# Patient Record
Sex: Female | Born: 1990 | Race: White | Hispanic: No | Marital: Married | Smoking: Never smoker
Health system: Southern US, Community
[De-identification: ages and names within clinical notes are randomized; demographics above are authoritative.]

## PROBLEM LIST (undated history)

## (undated) ENCOUNTER — Inpatient Hospital Stay (HOSPITAL_COMMUNITY): Payer: Self-pay

## (undated) DIAGNOSIS — Z8489 Family history of other specified conditions: Secondary | ICD-10-CM

## (undated) DIAGNOSIS — N62 Hypertrophy of breast: Secondary | ICD-10-CM

## (undated) DIAGNOSIS — R203 Hyperesthesia: Secondary | ICD-10-CM

## (undated) HISTORY — PX: CYSTOSCOPY: SUR368

## (undated) HISTORY — PX: WISDOM TOOTH EXTRACTION: SHX21

## (undated) HISTORY — PX: WRIST GANGLION EXCISION: SHX840

---
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
## 2012-11-15 ENCOUNTER — Other Ambulatory Visit: Payer: Self-pay | Admitting: Obstetrics and Gynecology

## 2012-11-15 ENCOUNTER — Encounter (HOSPITAL_COMMUNITY): Payer: Self-pay | Admitting: Pharmacist

## 2012-11-16 ENCOUNTER — Other Ambulatory Visit (HOSPITAL_COMMUNITY): Payer: Self-pay | Admitting: Obstetrics and Gynecology

## 2012-11-16 DIAGNOSIS — O021 Missed abortion: Secondary | ICD-10-CM

## 2012-11-20 ENCOUNTER — Ambulatory Visit (HOSPITAL_COMMUNITY): Payer: 59

## 2012-11-20 ENCOUNTER — Encounter (HOSPITAL_COMMUNITY)
Admission: AD | Disposition: A | Discharge status: Home / Self Care | Payer: Self-pay | Source: Ambulatory Visit | Attending: Obstetrics and Gynecology

## 2012-11-20 ENCOUNTER — Ambulatory Visit (HOSPITAL_COMMUNITY)
Admission: AD | Admit: 2012-11-20 | Discharge: 2012-11-20 | Disposition: A | Discharge status: Home / Self Care | Payer: 59 | Source: Ambulatory Visit | Attending: Obstetrics and Gynecology | Admitting: Obstetrics and Gynecology

## 2012-11-20 ENCOUNTER — Encounter (HOSPITAL_COMMUNITY): Payer: 59 | Admitting: Anesthesiology

## 2012-11-20 ENCOUNTER — Ambulatory Visit (HOSPITAL_COMMUNITY): Payer: 59 | Admitting: Anesthesiology

## 2012-11-20 ENCOUNTER — Encounter (HOSPITAL_COMMUNITY): Payer: Self-pay | Admitting: Anesthesiology

## 2012-11-20 DIAGNOSIS — O021 Missed abortion: Secondary | ICD-10-CM | POA: Insufficient documentation

## 2012-11-20 HISTORY — PX: DILATION AND EVACUATION: SHX1459

## 2012-11-20 LAB — ABO/RH: ABO/RH(D): A POS

## 2012-11-20 LAB — CBC
Hemoglobin: 13.4 g/dL (ref 12.0–15.0)
MCH: 28.9 pg (ref 26.0–34.0)
MCHC: 33.4 g/dL (ref 30.0–36.0)
Platelets: 260 10*3/uL (ref 150–400)

## 2012-11-20 SURGERY — DILATION AND EVACUATION, UTERUS
Anesthesia: Monitor Anesthesia Care | Site: Vagina | Wound class: Clean Contaminated

## 2012-11-20 MED ORDER — PROPOFOL 10 MG/ML IV EMUL
INTRAVENOUS | Status: DC | PRN
  Administered 2012-11-20 (×5): 20 mg via INTRAVENOUS
  Administered 2012-11-20: 40 mg via INTRAVENOUS

## 2012-11-20 MED ORDER — FENTANYL CITRATE 0.05 MG/ML IJ SOLN
INTRAMUSCULAR | Status: AC
  Filled 2012-11-20: qty 2

## 2012-11-20 MED ORDER — PROPOFOL 10 MG/ML IV EMUL
INTRAVENOUS | Status: AC
  Filled 2012-11-20: qty 40

## 2012-11-20 MED ORDER — KETOROLAC TROMETHAMINE 30 MG/ML IJ SOLN
INTRAMUSCULAR | Status: AC
  Filled 2012-11-20: qty 1

## 2012-11-20 MED ORDER — LIDOCAINE HCL (CARDIAC) 20 MG/ML IV SOLN
INTRAVENOUS | Status: AC
  Filled 2012-11-20: qty 5

## 2012-11-20 MED ORDER — FENTANYL CITRATE 0.05 MG/ML IJ SOLN
INTRAMUSCULAR | Status: DC | PRN
  Administered 2012-11-20 (×2): 50 ug via INTRAVENOUS

## 2012-11-20 MED ORDER — HYDROCODONE-ACETAMINOPHEN 5-325 MG PO TABS: ORAL_TABLET | ORAL | Status: DC

## 2012-11-20 MED ORDER — KETOROLAC TROMETHAMINE 30 MG/ML IJ SOLN
INTRAMUSCULAR | Status: DC | PRN
  Administered 2012-11-20: 30 mg via INTRAVENOUS

## 2012-11-20 MED ORDER — LACTATED RINGERS IV SOLN: INTRAVENOUS | Status: DC

## 2012-11-20 MED ORDER — LIDOCAINE HCL 1 % IJ SOLN
INTRAMUSCULAR | Status: DC | PRN
  Administered 2012-11-20: 10 mL

## 2012-11-20 MED ORDER — LACTATED RINGERS IV SOLN
INTRAVENOUS | Status: DC
  Administered 2012-11-20: 11:00:00 via INTRAVENOUS

## 2012-11-20 MED ORDER — ONDANSETRON HCL 4 MG/2ML IJ SOLN
INTRAMUSCULAR | Status: DC | PRN
  Administered 2012-11-20: 4 mg via INTRAVENOUS

## 2012-11-20 MED ORDER — MIDAZOLAM HCL 2 MG/2ML IJ SOLN
INTRAMUSCULAR | Status: DC | PRN
  Administered 2012-11-20: 2 mg via INTRAVENOUS

## 2012-11-20 MED ORDER — MIDAZOLAM HCL 2 MG/2ML IJ SOLN
INTRAMUSCULAR | Status: AC
  Filled 2012-11-20: qty 2

## 2012-11-20 MED ORDER — ONDANSETRON HCL 4 MG/2ML IJ SOLN
INTRAMUSCULAR | Status: AC
  Filled 2012-11-20: qty 2

## 2012-11-20 SURGICAL SUPPLY — 20 items
CATH ROBINSON RED A/P 16FR (CATHETERS) ×2 IMPLANT
CLOTH BEACON ORANGE TIMEOUT ST (SAFETY) ×2 IMPLANT
DECANTER SPIKE VIAL GLASS SM (MISCELLANEOUS) ×2 IMPLANT
DILATOR CANAL MILEX (MISCELLANEOUS) IMPLANT
GLOVE BIO SURGEON STRL SZ7 (GLOVE) ×2 IMPLANT
GOWN STRL REIN XL XLG (GOWN DISPOSABLE) ×4 IMPLANT
KIT BERKELEY 1ST TRIMESTER 3/8 (MISCELLANEOUS) ×2 IMPLANT
NDL SPNL 22GX3.5 QUINCKE BK (NEEDLE) ×1 IMPLANT
NEEDLE SPNL 22GX3.5 QUINCKE BK (NEEDLE) ×2 IMPLANT
NS IRRIG 1000ML POUR BTL (IV SOLUTION) ×2 IMPLANT
PACK VAGINAL MINOR WOMEN LF (CUSTOM PROCEDURE TRAY) ×2 IMPLANT
PAD OB MATERNITY 4.3X12.25 (PERSONAL CARE ITEMS) ×2 IMPLANT
PAD PREP 24X48 CUFFED NSTRL (MISCELLANEOUS) ×2 IMPLANT
SET BERKELEY SUCTION TUBING (SUCTIONS) ×2 IMPLANT
SYR CONTROL 10ML LL (SYRINGE) ×2 IMPLANT
TOWEL OR 17X24 6PK STRL BLUE (TOWEL DISPOSABLE) ×4 IMPLANT
VACURETTE 10 RIGID CVD (CANNULA) IMPLANT
VACURETTE 7MM CVD STRL WRAP (CANNULA) IMPLANT
VACURETTE 8 RIGID CVD (CANNULA) ×1 IMPLANT
VACURETTE 9 RIGID CVD (CANNULA) IMPLANT

## 2012-11-20 NOTE — H&P (Signed)
SARABELLA CAPRIO is an 22 y.o. female D&E for missed abortion  22 yo G1P0 who presented for her First prenatal visit on 11/14/2012. Based on a last menstrual period of 09/12/2012 The patient should have been 9 weeks +0 days estimated gestational age. Ultrasound at that visit showed a intrauterine pregnancy with a fetal pole measuring 7+3 weeks, however no fetal cardiac activity is visualized consistent with a missed abortion. At that time patient was in shock and was unable to make a position as to how she wished to proceed. She wishes to proceed today with definitive surgical management. She has requested a preoperative repeat transvaginal ultrasound to confirm the prior finding of fetal demise on 11/14/2012. This will be performed prior to surgery. Risks benefits alternatives of procedure were discussed at length with the patient.   No LMP recorded.    No past medical history on file.  No past surgical history on file.  No family history on file.  Social History:  has no tobacco, alcohol, and drug history on file.  Allergies: No Known Allergies  No prescriptions prior to admission    ROS  Height 5\' 5"  (1.651 m), weight 103.874 kg (229 lb). Physical Exam  No results found for this or any previous visit (from the past 24 hour(s)).  No results found.  Assessment/Plan: 1) Admit 2) Repeat TVUS 3) Consent for suction D&E  Trixy Loyola H. 11/20/2012, 9:07 AM

## 2012-11-20 NOTE — Anesthesia Postprocedure Evaluation (Signed)
Anesthesia Post Note  Patient: [name] Rosales  Procedure(s) Performed: Procedure(s) (LRB): DILATATION AND EVACUATION WITH ULTRASOUND BEFORE SURGERY (N/A)  Anesthesia type: MAC  Patient location: PACU  Post pain: Pain level controlled  Post assessment: Post-op Vital signs reviewed  Last Vitals:  Filed Vitals:   11/20/12 1200  BP:   Pulse: 84  Temp:   Resp: 20    Post vital signs: Reviewed  Level of consciousness: sedated  Complications: No apparent anesthesia complications

## 2012-11-20 NOTE — Op Note (Signed)
Pre-Operative Diagnosis: 1) 9 week missed abortion Postoperative Diagnosis: Same Procedure: Suction Dilation & Evacuation Surgeon: Dr. Waynard Reeds Assistant: None Operative Findings: Midposition uterus, POCs Specimen: POCs EBL: Total I/O In: 500 [I.V.:500] Out: 75 [Urine:75]   [name] Rosales Is a 22 year old gravida 1 para 0 who presents for definitive surgical management for 9 week missed abortion. Please see the patient's history and physical for complete details of the history. Management options were discussed with the patient. R/B/A reviewed. Following appropriate informed consent was taken to the operating room. The patient was appropriately identified during a time out procedure. MAC anesthesia was administered and the patient was placed in the dorsal lithotomy position. The patient was prepped and draped in the normal sterile fashion. A speculum was placed into the vagina, a single-tooth tenaculum was placed on the anterior lip of the cervix, and 10 cc of 1% lidocaine was administered in a paracervical fashion. The cervix was serially dilated with Hank dilators. A 8 french suction curet was then passed to the fundus, the vacuum was engaged, and 3 suction passes were performed with a curette. A Sharp curettage was performed and a gritty texture was noted. A final suction pass was performed with minimal results. This completed the procedure. The patient tolerated the procedure well was brought to the recovery room in stable condition for the procedure. All sponge and needle counts correct x2.

## 2012-11-20 NOTE — Transfer of Care (Signed)
Immediate Anesthesia Transfer of Care Note  Patient: [name] Rosales  Procedure(s) Performed: Procedure(s): DILATATION AND EVACUATION WITH ULTRASOUND BEFORE SURGERY (N/A)  Patient Location: PACU  Anesthesia Type:MAC  Level of Consciousness: awake, alert  and oriented  Airway & Oxygen Therapy: Patient Spontanous Breathing and Patient connected to nasal cannula oxygen  Post-op Assessment: Report given to PACU RN and Post -op Vital signs reviewed and stable  Post vital signs: Reviewed and stable  Complications: No apparent anesthesia complications

## 2012-11-20 NOTE — Interval H&P Note (Signed)
History and Physical Interval Note:  Pre-operative [place] confirms non-viable pregnancy. No fetal cardiac activity visualized. Patient comfortable with proceeding. Pts BT confirmed positive in office  11/20/2012 11:02 AM  [name] Rosales  has presented today for surgery, with the diagnosis of missed abortion [phone]  The various methods of treatment have been discussed with the patient and family. After consideration of risks, benefits and other options for treatment, the patient has consented to  Procedure(s): DILATATION AND EVACUATION WITH ULTRASOUND BEFORE SURGERY (N/A) as a surgical intervention .  The patient's history has been reviewed, patient examined, no change in status, stable for surgery.  I have reviewed the patient's chart and labs.  Questions were answered to the patient's satisfaction.     Charnika Herbst H.

## 2012-11-20 NOTE — Anesthesia Preprocedure Evaluation (Signed)
Anesthesia Evaluation  Patient identified by MRN, date of birth, ID band Patient awake    Reviewed: Allergy & Precautions, H&P , NPO status , Patient's Chart, lab work & pertinent test results  Airway Mallampati: III TM Distance: >3 FB Neck ROM: Full    Dental no notable dental hx. (+) Teeth Intact   Pulmonary neg pulmonary ROS,  breath sounds clear to auscultation  Pulmonary exam normal       Cardiovascular negative cardio ROS  Rhythm:Regular Rate:Normal     Neuro/Psych negative neurological ROS  negative psych ROS   GI/Hepatic negative GI ROS, Neg liver ROS,   Endo/Other  Morbid obesity  Renal/GU negative Renal ROS  negative genitourinary   Musculoskeletal negative musculoskeletal ROS (+)   Abdominal (+) + obese,   Peds  Hematology negative hematology ROS (+)   Anesthesia Other Findings   Reproductive/Obstetrics (+) Pregnancy Missed Ab  10 weeks                            Anesthesia Physical Anesthesia Plan  ASA: II  Anesthesia Plan: MAC   Post-op Pain Management:    Induction: Intravenous  Airway Management Planned: Natural Airway and Simple Face Mask  Additional Equipment:   Intra-op Plan:   Post-operative Plan:   Informed Consent: I have reviewed the patients History and Physical, chart, labs and discussed the procedure including the risks, benefits and alternatives for the proposed anesthesia with the patient or authorized representative who has indicated his/her understanding and acceptance.     Plan Discussed with: CRNA, Anesthesiologist and Surgeon  Anesthesia Plan Comments:         Anesthesia Quick Evaluation

## 2012-11-21 ENCOUNTER — Encounter (HOSPITAL_COMMUNITY): Payer: Self-pay | Admitting: Obstetrics and Gynecology

## 2013-07-10 ENCOUNTER — Encounter (HOSPITAL_COMMUNITY): Payer: Self-pay

## 2013-07-10 ENCOUNTER — Inpatient Hospital Stay (HOSPITAL_COMMUNITY): Payer: 59

## 2013-07-10 ENCOUNTER — Inpatient Hospital Stay (HOSPITAL_COMMUNITY)
Admission: AD | Admit: 2013-07-10 | Discharge: 2013-07-10 | Disposition: A | Discharge status: Home / Self Care | Payer: 59 | Source: Ambulatory Visit | Attending: Obstetrics and Gynecology | Admitting: Obstetrics and Gynecology

## 2013-07-10 DIAGNOSIS — O209 Hemorrhage in early pregnancy, unspecified: Secondary | ICD-10-CM | POA: Insufficient documentation

## 2013-07-10 LAB — URINALYSIS, ROUTINE W REFLEX MICROSCOPIC
Bilirubin Urine: NEGATIVE
Glucose, UA: NEGATIVE mg/dL
Ketones, ur: NEGATIVE mg/dL
LEUKOCYTES UA: NEGATIVE
NITRITE: NEGATIVE
Protein, ur: NEGATIVE mg/dL
UROBILINOGEN UA: 0.2 mg/dL (ref 0.0–1.0)
pH: 5.5 (ref 5.0–8.0)

## 2013-07-10 LAB — URINE MICROSCOPIC-ADD ON

## 2013-07-10 NOTE — MAU Note (Signed)
Patient states she had her first appointment yesterday. States she started having cramping this am and had a 50 cent size dark red blood clot.

## 2013-07-10 NOTE — Discharge Instructions (Signed)
Keep your scheduled appointment for prenatal care. Call the office or provider on call with increased bleeding, pain or further concerns. Pelvic rest X 1 week.

## 2013-07-12 ENCOUNTER — Encounter (HOSPITAL_COMMUNITY): Payer: Self-pay | Admitting: Family

## 2013-07-12 ENCOUNTER — Inpatient Hospital Stay (HOSPITAL_COMMUNITY): Payer: 59

## 2013-07-12 ENCOUNTER — Inpatient Hospital Stay (HOSPITAL_COMMUNITY)
Admission: AD | Admit: 2013-07-12 | Discharge: 2013-07-12 | Disposition: A | Discharge status: Home / Self Care | Payer: 59 | Source: Ambulatory Visit | Attending: Obstetrics and Gynecology | Admitting: Obstetrics and Gynecology

## 2013-07-12 DIAGNOSIS — O021 Missed abortion: Secondary | ICD-10-CM | POA: Insufficient documentation

## 2013-07-12 LAB — URINALYSIS, ROUTINE W REFLEX MICROSCOPIC
BILIRUBIN URINE: NEGATIVE
GLUCOSE, UA: NEGATIVE mg/dL
Ketones, ur: NEGATIVE mg/dL
Nitrite: NEGATIVE
PROTEIN: 30 mg/dL — AB
Specific Gravity, Urine: 1.025 (ref 1.005–1.030)
Urobilinogen, UA: 2 mg/dL — ABNORMAL HIGH (ref 0.0–1.0)
pH: 6 (ref 5.0–8.0)

## 2013-07-12 LAB — URINE MICROSCOPIC-ADD ON

## 2013-07-12 NOTE — Anesthesia Preprocedure Evaluation (Addendum)
Anesthesia Evaluation  Patient identified by MRN, date of birth, ID band Patient awake    Reviewed: Allergy & Precautions, H&P , Patient's Chart, lab work & pertinent test results, reviewed documented beta blocker date and time   History of Anesthesia Complications Negative for: history of anesthetic complications  Airway Mallampati: III TM Distance: >3 FB Neck ROM: full    Dental   Pulmonary  breath sounds clear to auscultation        Cardiovascular Exercise Tolerance: Good Rhythm:regular Rate:Normal     Neuro/Psych negative psych ROS   GI/Hepatic   Endo/Other  Morbid obesity  Renal/GU      Musculoskeletal   Abdominal   Peds  Hematology   Anesthesia Other Findings   Reproductive/Obstetrics                           Anesthesia Physical Anesthesia Plan  ASA: III  Anesthesia Plan: General LMA   Post-op Pain Management:    Induction:   Airway Management Planned:   Additional Equipment:   Intra-op Plan:   Post-operative Plan:   Informed Consent: I have reviewed the patients History and Physical, chart, labs and discussed the procedure including the risks, benefits and alternatives for the proposed anesthesia with the patient or authorized representative who has indicated his/her understanding and acceptance.   Dental Advisory Given  Plan Discussed with: CRNA, Surgeon and Anesthesiologist  Anesthesia Plan Comments:         Anesthesia Quick Evaluation  

## 2013-07-12 NOTE — H&P (Signed)
[name] Rosales is an 23 y.o. G2P0010  at3047w5d EGA who presents to the ER c/o continued bleeding. She was seen 2 days ago with a similar complaint. At that time she had an u/s which indicated that she had an IUP with FHTS. She had an u/s today which [facility] and the gest sac was in the cx. She desires to have a D&C. She had an early MAB with her last preg. She is a pt of Dr. Fredia SorrowK Ross.  Chief Complaint: HPI:  Past Medical History  Diagnosis Date  . Medical history non-contributory     Past Surgical History  Procedure Laterality Date  . Dilation and evacuation N/A 11/20/2012    Procedure: DILATATION AND EVACUATION WITH ULTRASOUND BEFORE SURGERY;  Surgeon: Freddrick MarchKendra H. Tenny Crawoss, MD;  Location: [facility];  Service: Gynecology;  Laterality: N/A;  . Wrist surgery Left     To remove a cyst  . Cystoscopy      Age 23    History reviewed. No pertinent family history. Social History:  reports that she has never smoked. She has never used smokeless tobacco. She reports that she drinks alcohol. She reports that she does not use illicit drugs.  Allergies: No Known Allergies  Medications Prior to Admission  Medication Sig Dispense Refill  . Prenatal Vit-Fe Fumarate-FA (PRENATAL MULTIVITAMIN) TABS tablet Take 1 tablet by mouth daily at 12 noon.           Blood pressure 113/62, pulse 94, temperature 98.1 F (36.7 C), temperature source Oral, resp. rate 20, height 5' 4.5" (1.638 m), weight 107.956 kg (238 lb). Exam done by nurse practitioner.   Lab Results  Component Value Date   WBC 6.7 11/20/2012   HGB 13.4 11/20/2012   HCT 40.1 11/20/2012   MCV 86.6 11/20/2012   PLT 260 11/20/2012   No results found for this basename: PREGTESTUR, PREGSERUM, HCG, HCGQUANT     Assessment/Plan   Imp/ SAB PLAN/ Proceed with D&C.  ANDERSON,MARK E 07/12/2013, 4:52 PM

## 2013-07-12 NOTE — Discharge Instructions (Signed)
Return to MAU in the morning at 0730 for surgery at 0900. Nothing by mouth after midnight.

## 2013-07-12 NOTE — MAU Note (Signed)
Patient presents with complaint of vaginal bleeding X 3 days.

## 2013-07-12 NOTE — MAU Provider Note (Signed)
  History     CSN: [id number]  Arrival date and time: 07/12/13 1417   None     Chief Complaint  Patient presents with  . Vaginal Bleeding   Vaginal Bleeding Associated symptoms include abdominal pain (cramping (earlier)).    Pt is a 23 yo G2P0010 at 3832w5d wks IUP here with bleeding x 3 days.  Ultrasound completed on 6/23 showed a 6.6 wk IUP.  Pt denies any cramping at this time.  Reports passing some "tissue like" clots earlier today.     Past Medical History  Diagnosis Date  . Medical history non-contributory     Past Surgical History  Procedure Laterality Date  . Dilation and evacuation N/A 11/20/2012    Procedure: DILATATION AND EVACUATION WITH ULTRASOUND BEFORE SURGERY;  Surgeon: Freddrick MarchKendra H. Tenny Crawoss, MD;  Location: [facility];  Service: Gynecology;  Laterality: N/A;  . Wrist surgery Left     To remove a cyst  . Cystoscopy      Age 75    History reviewed. No pertinent family history.  History  Substance Use Topics  . Smoking status: Never Smoker   . Smokeless tobacco: Never Used  . Alcohol Use: Yes     Comment: occas. prior to preg.    Allergies: No Known Allergies  Prescriptions prior to admission  Medication Sig Dispense Refill  . Prenatal Vit-Fe Fumarate-FA (PRENATAL MULTIVITAMIN) TABS tablet Take 1 tablet by mouth daily at 12 noon.        Review of Systems  Gastrointestinal: Positive for abdominal pain (cramping (earlier)).  Genitourinary: Positive for vaginal bleeding.       Vaginal bleeding  Neurological: Negative for dizziness.  All other systems reviewed and are negative.  Physical Exam   Blood pressure 113/62, pulse 94, temperature 98.1 F (36.7 C), temperature source Oral, resp. rate 20, height 5' 4.5" (1.638 m), weight 238 lb (107.956 kg).  Physical Exam  Constitutional: She is oriented to person, place, and time. She appears well-developed and well-nourished.  Uncomfortable appearing  HENT:  Head: Normocephalic.  Neck: Normal range of motion.  Neck supple.  Cardiovascular: Normal rate, regular rhythm and normal heart sounds.  Exam reveals no gallop and no friction rub.   No murmur heard. Respiratory: Effort normal and breath sounds normal. No respiratory distress.  GI: Soft. There is no tenderness (suprapubic).  Genitourinary: There is bleeding (moderate; + small clots ) around the vagina.  Neurological: She is alert and oriented to person, place, and time.  Skin: Skin is warm and dry.    MAU Course  Procedures  Ultrasound: FINDINGS:  Gestational sac is visualized and demonstrates a slightly irregular  in shape. The gestational sac is visualized in the cervical canal.  There are heterogeneous echoes surrounding a sac likely representing  areas of hemorrhage.  Yolk sac: Not visualized  Embryo: Appreciated  Cardiac Activity: None  Heart Rate: Not applicable  MSD: 10.9 mm 5 w 6 d  CRL: 9.4 mm 7 w 0 d  A small subchorionic hemorrhage is appreciated.  Maternal uterus/adnexae: Unremarkable  IMPRESSION:  Ultrasound findings consistent with an active abortion. Correlation  with serial beta HCG is recommended. And if clinically warranted  re-evaluation with ultrasound.  1620 Dr. Dareen PianoAnderson called and given report > plans to come in and see patient.   Assessment and Plan  Incomplete miscarriage  [name],[name] 07/12/2013, 4:13 PM

## 2013-07-13 ENCOUNTER — Encounter (HOSPITAL_COMMUNITY): Payer: 59 | Admitting: Anesthesiology

## 2013-07-13 ENCOUNTER — Encounter (HOSPITAL_COMMUNITY): Payer: Self-pay | Admitting: *Deleted

## 2013-07-13 ENCOUNTER — Encounter (HOSPITAL_COMMUNITY)
Admission: RE | Disposition: A | Discharge status: Home / Self Care | Payer: Self-pay | Source: Ambulatory Visit | Attending: Obstetrics and Gynecology

## 2013-07-13 ENCOUNTER — Ambulatory Visit (HOSPITAL_COMMUNITY)
Admission: RE | Admit: 2013-07-13 | Discharge: 2013-07-13 | Disposition: A | Discharge status: Home / Self Care | Payer: 59 | Source: Ambulatory Visit | Attending: Obstetrics and Gynecology | Admitting: Obstetrics and Gynecology

## 2013-07-13 ENCOUNTER — Encounter (HOSPITAL_COMMUNITY): Payer: Self-pay

## 2013-07-13 ENCOUNTER — Inpatient Hospital Stay (HOSPITAL_COMMUNITY): Payer: 59 | Admitting: Anesthesiology

## 2013-07-13 DIAGNOSIS — O021 Missed abortion: Secondary | ICD-10-CM | POA: Insufficient documentation

## 2013-07-13 DIAGNOSIS — Z9889 Other specified postprocedural states: Secondary | ICD-10-CM

## 2013-07-13 HISTORY — PX: DILATION AND EVACUATION: SHX1459

## 2013-07-13 LAB — TYPE AND SCREEN
ABO/RH(D): A POS
ANTIBODY SCREEN: NEGATIVE

## 2013-07-13 LAB — CBC
HCT: 39.5 % (ref 36.0–46.0)
Hemoglobin: 12.9 g/dL (ref 12.0–15.0)
MCH: 29.4 pg (ref 26.0–34.0)
MCHC: 32.7 g/dL (ref 30.0–36.0)
MCV: 90 fL (ref 78.0–100.0)
PLATELETS: 277 10*3/uL (ref 150–400)
RBC: 4.39 MIL/uL (ref 3.87–5.11)
RDW: 14.2 % (ref 11.5–15.5)
WBC: 9.9 10*3/uL (ref 4.0–10.5)

## 2013-07-13 SURGERY — DILATION AND EVACUATION, UTERUS
Anesthesia: General | Site: Vagina

## 2013-07-13 SURGERY — DILATION AND EVACUATION, UTERUS
Anesthesia: Choice

## 2013-07-13 MED ORDER — ONDANSETRON HCL 4 MG/2ML IJ SOLN
INTRAMUSCULAR | Status: DC | PRN
  Administered 2013-07-13: 4 mg via INTRAVENOUS

## 2013-07-13 MED ORDER — LACTATED RINGERS IV SOLN
INTRAVENOUS | Status: DC
  Administered 2013-07-13 (×2): via INTRAVENOUS

## 2013-07-13 MED ORDER — METHYLERGONOVINE MALEATE 0.2 MG/ML IJ SOLN
INTRAMUSCULAR | Status: DC | PRN
  Administered 2013-07-13: 0.2 mg via INTRAMUSCULAR

## 2013-07-13 MED ORDER — KETOROLAC TROMETHAMINE 30 MG/ML IJ SOLN: 15.0000 mg | Freq: Once | INTRAMUSCULAR | Status: DC | PRN

## 2013-07-13 MED ORDER — FENTANYL CITRATE 0.05 MG/ML IJ SOLN
INTRAMUSCULAR | Status: AC
  Filled 2013-07-13: qty 5

## 2013-07-13 MED ORDER — ONDANSETRON HCL 4 MG/2ML IJ SOLN
INTRAMUSCULAR | Status: AC
  Filled 2013-07-13: qty 2

## 2013-07-13 MED ORDER — MIDAZOLAM HCL 2 MG/2ML IJ SOLN: 0.5000 mg | Freq: Once | INTRAMUSCULAR | Status: DC | PRN

## 2013-07-13 MED ORDER — FENTANYL CITRATE 0.05 MG/ML IJ SOLN
INTRAMUSCULAR | Status: AC
  Administered 2013-07-13: 25 ug via INTRAVENOUS
  Filled 2013-07-13: qty 2

## 2013-07-13 MED ORDER — MEPERIDINE HCL 25 MG/ML IJ SOLN: 6.2500 mg | INTRAMUSCULAR | Status: DC | PRN

## 2013-07-13 MED ORDER — MIDAZOLAM HCL 2 MG/2ML IJ SOLN
INTRAMUSCULAR | Status: AC
  Filled 2013-07-13: qty 2

## 2013-07-13 MED ORDER — PROMETHAZINE HCL 25 MG/ML IJ SOLN: 6.2500 mg | INTRAMUSCULAR | Status: DC | PRN

## 2013-07-13 MED ORDER — MIDAZOLAM HCL 2 MG/2ML IJ SOLN
INTRAMUSCULAR | Status: DC | PRN
  Administered 2013-07-13: 2 mg via INTRAVENOUS

## 2013-07-13 MED ORDER — FENTANYL CITRATE 0.05 MG/ML IJ SOLN
INTRAMUSCULAR | Status: DC | PRN
  Administered 2013-07-13: 100 ug via INTRAVENOUS

## 2013-07-13 MED ORDER — KETOROLAC TROMETHAMINE 30 MG/ML IJ SOLN
INTRAMUSCULAR | Status: DC | PRN
  Administered 2013-07-13: 30 mg via INTRAVENOUS

## 2013-07-13 MED ORDER — LIDOCAINE HCL (CARDIAC) 20 MG/ML IV SOLN
INTRAVENOUS | Status: DC | PRN
  Administered 2013-07-13: 80 mg via INTRAVENOUS

## 2013-07-13 MED ORDER — DEXAMETHASONE SODIUM PHOSPHATE 10 MG/ML IJ SOLN
INTRAMUSCULAR | Status: AC
  Filled 2013-07-13: qty 1

## 2013-07-13 MED ORDER — FENTANYL CITRATE 0.05 MG/ML IJ SOLN
25.0000 ug | INTRAMUSCULAR | Status: DC | PRN
  Administered 2013-07-13: 25 ug via INTRAVENOUS

## 2013-07-13 MED ORDER — DEXAMETHASONE SODIUM PHOSPHATE 4 MG/ML IJ SOLN
INTRAMUSCULAR | Status: DC | PRN
Start: 2013-07-13 — End: 2013-07-13
  Administered 2013-07-13: 10 mg via INTRAVENOUS

## 2013-07-13 MED ORDER — PROPOFOL INFUSION 10 MG/ML OPTIME
INTRAVENOUS | Status: DC | PRN
  Administered 2013-07-13: 200 mL via INTRAVENOUS

## 2013-07-13 MED ORDER — LIDOCAINE HCL (CARDIAC) 20 MG/ML IV SOLN
INTRAVENOUS | Status: AC
  Filled 2013-07-13: qty 5

## 2013-07-13 MED ORDER — PROPOFOL 10 MG/ML IV EMUL
INTRAVENOUS | Status: AC
  Filled 2013-07-13: qty 20

## 2013-07-13 SURGICAL SUPPLY — 18 items
CATH ROBINSON RED A/P 16FR (CATHETERS) ×3 IMPLANT
CLOTH BEACON ORANGE TIMEOUT ST (SAFETY) ×3 IMPLANT
DECANTER SPIKE VIAL GLASS SM (MISCELLANEOUS) ×1 IMPLANT
GLOVE BIO SURGEON STRL SZ7 (GLOVE) ×3 IMPLANT
GLOVE BIOGEL PI IND STRL 7.0 (GLOVE) ×1 IMPLANT
GLOVE BIOGEL PI INDICATOR 7.0 (GLOVE) ×2
GOWN STRL REUS W/TWL LRG LVL3 (GOWN DISPOSABLE) ×6 IMPLANT
KIT BERKELEY 1ST TRIMESTER 3/8 (MISCELLANEOUS) ×3 IMPLANT
NS IRRIG 1000ML POUR BTL (IV SOLUTION) ×3 IMPLANT
PACK VAGINAL MINOR WOMEN LF (CUSTOM PROCEDURE TRAY) ×3 IMPLANT
PAD OB MATERNITY 4.3X12.25 (PERSONAL CARE ITEMS) ×3 IMPLANT
PAD PREP 24X48 CUFFED NSTRL (MISCELLANEOUS) ×3 IMPLANT
SET BERKELEY SUCTION TUBING (SUCTIONS) ×3 IMPLANT
TOWEL OR 17X24 6PK STRL BLUE (TOWEL DISPOSABLE) ×6 IMPLANT
VACURETTE 10 RIGID CVD (CANNULA) ×2 IMPLANT
VACURETTE 7MM CVD STRL WRAP (CANNULA) IMPLANT
VACURETTE 8 RIGID CVD (CANNULA) IMPLANT
VACURETTE 9 RIGID CVD (CANNULA) IMPLANT

## 2013-07-13 NOTE — Brief Op Note (Addendum)
07/13/2013  9:33 AM  PATIENT:  [name] Rosales  22 y.o. female  PRE-OPERATIVE DIAGNOSIS:  missed abortion  POST-OPERATIVE DIAGNOSIS:  missed abortion  PROCEDURE:  Procedure(s): DILATATION AND EVACUATION (N/A)  SURGEON:  Surgeon(s) and Role:    * Michelle A Horvath, MD - Primary   ANESTHESIA:   general  EBL:   min  SPECIMEN:  Source of Specimen:  poc  DISPOSITION OF SPECIMEN:  PATHOLOGY  COUNTS:  YES  TOURNIQUET:  * No tourniquets in log *  DICTATION: .Note written in EPIC  PLAN OF CARE: Discharge to home after PACU  PATIENT DISPOSITION:  PACU - hemodynamically stable.   Delay start of Pharmacological VTE agent (>24hrs) due to surgical blood loss or risk of bleeding: not applicable  After adequate anesthesia was achieved, the patient was prepped and draped in the usual sterile fashion.  The speculum was placed in the vagina and the cervix stabilized with a single-tooth tenaculum.  The cervix was dilated with Pratt dilators and the 10 mm curette was used to remove contents of the uterus.  Alternating sharp curettage with a curette and suction curettage was performed until all contents were removed.  All instruments were removed from the vagina and AgNo3 used to achieve hemostasis at tenaculum site.  The patient tolerated the procedure well.    HORVATH,MICHELLE A   

## 2013-07-13 NOTE — Transfer of Care (Signed)
Immediate Anesthesia Transfer of Care Note  Patient: [name] Rosales  Procedure(s) Performed: Procedure(s): DILATATION AND EVACUATION (N/A)  Patient Location: PACU  Anesthesia Type:General  Level of Consciousness: awake, alert  and oriented  Airway & Oxygen Therapy: Patient Spontanous Breathing and Patient connected to nasal cannula oxygen  Post-op Assessment: Report given to PACU RN and Post -op Vital signs reviewed and stable  Post vital signs: Reviewed and stable  Complications: No apparent anesthesia complications

## 2013-07-13 NOTE — Op Note (Signed)
07/13/2013  9:33 AM  PATIENT:  [name] Rosales  23 y.o. female  PRE-OPERATIVE DIAGNOSIS:  missed abortion  POST-OPERATIVE DIAGNOSIS:  missed abortion  PROCEDURE:  Procedure(s): DILATATION AND EVACUATION (N/A)  SURGEON:  Surgeon(s) and Role:    * Loney LaurenceMichelle A Horvath, MD - Primary   ANESTHESIA:   general  EBL:   min  SPECIMEN:  Source of Specimen:  poc  DISPOSITION OF SPECIMEN:  PATHOLOGY  COUNTS:  YES  TOURNIQUET:  * No tourniquets in log *  DICTATION: .Note written in EPIC  PLAN OF CARE: Discharge to home after PACU  PATIENT DISPOSITION:  PACU - hemodynamically stable.   Delay start of Pharmacological VTE agent (>24hrs) due to surgical blood loss or risk of bleeding: not applicable  After adequate anesthesia was achieved, the patient was prepped and draped in the usual sterile fashion.  The speculum was placed in the vagina and the cervix stabilized with a single-tooth tenaculum.  The cervix was dilated with Shawnie PonsPratt dilators and the 10 mm curette was used to remove contents of the uterus.  Alternating sharp curettage with a curette and suction curettage was performed until all contents were removed.  All instruments were removed from the vagina and AgNo3 used to achieve hemostasis at tenaculum site.  The patient tolerated the procedure well.    HORVATH,MICHELLE A

## 2013-07-13 NOTE — Anesthesia Postprocedure Evaluation (Signed)
Anesthesia Post Note  Patient: [name] Rosales  Procedure(s) Performed: Procedure(s) (LRB): DILATATION AND EVACUATION (N/A)  Anesthesia type: GA  Patient location: PACU  Post pain: Pain level controlled  Post assessment: Post-op Vital signs reviewed  Last Vitals:  Filed Vitals:   07/13/13 1015  BP: 111/67  Pulse: 79  Temp:   Resp: 17    Post vital signs: Reviewed  Level of consciousness: sedated  Complications: No apparent anesthesia complications

## 2013-07-13 NOTE — Progress Notes (Signed)
There has been no change in the patients history, status or exam since the history and physical.  There were no vitals filed for this visit.  Lab Results  Component Value Date   WBC 6.7 11/20/2012   HGB 13.4 11/20/2012   HCT 40.1 11/20/2012   MCV 86.6 11/20/2012   PLT 260 11/20/2012   Pt is A+. HORVATH,MICHELLE A

## 2013-07-16 ENCOUNTER — Encounter (HOSPITAL_COMMUNITY): Payer: Self-pay | Admitting: Obstetrics and Gynecology

## 2013-10-11 LAB — OB RESULTS CONSOLE HEPATITIS B SURFACE ANTIGEN: Hepatitis B Surface Ag: NEGATIVE

## 2013-10-11 LAB — OB RESULTS CONSOLE GC/CHLAMYDIA
CHLAMYDIA, DNA PROBE: NEGATIVE
Gonorrhea: NEGATIVE

## 2013-10-11 LAB — OB RESULTS CONSOLE RUBELLA ANTIBODY, IGM: Rubella: IMMUNE

## 2013-10-11 LAB — OB RESULTS CONSOLE HIV ANTIBODY (ROUTINE TESTING): HIV: NONREACTIVE

## 2013-10-11 LAB — OB RESULTS CONSOLE RPR: RPR: NONREACTIVE

## 2013-11-19 ENCOUNTER — Encounter (HOSPITAL_COMMUNITY): Payer: Self-pay | Admitting: Obstetrics and Gynecology

## 2013-12-28 ENCOUNTER — Ambulatory Visit: Payer: Self-pay

## 2013-12-28 ENCOUNTER — Ambulatory Visit (INDEPENDENT_AMBULATORY_CARE_PROVIDER_SITE_OTHER): Payer: 59 | Admitting: Podiatry

## 2013-12-28 ENCOUNTER — Encounter: Payer: Self-pay | Admitting: Podiatry

## 2013-12-28 VITALS — BP 115/64 | HR 90 | Resp 12

## 2013-12-28 DIAGNOSIS — R52 Pain, unspecified: Secondary | ICD-10-CM

## 2013-12-28 DIAGNOSIS — M722 Plantar fascial fibromatosis: Secondary | ICD-10-CM

## 2013-12-28 NOTE — Patient Instructions (Signed)
Plantar Fasciitis (Heel Spur Syndrome) with Rehab The plantar fascia is a fibrous, ligament-like, soft-tissue structure that spans the bottom of the foot. Plantar fasciitis is a condition that causes pain in the foot due to inflammation of the tissue. SYMPTOMS   Pain and tenderness on the underneath side of the foot.  Pain that worsens with standing or walking. CAUSES  Plantar fasciitis is caused by irritation and injury to the plantar fascia on the underneath side of the foot. Common mechanisms of injury include:  Direct trauma to bottom of the foot.  Damage to a small nerve that runs under the foot where the main fascia attaches to the heel bone.  Stress placed on the plantar fascia due to bone spurs. RISK INCREASES WITH:   Activities that place stress on the plantar fascia (running, jumping, pivoting, or cutting).  Poor strength and flexibility.  Improperly fitted shoes.  Tight calf muscles.  Flat feet.  Failure to warm-up properly before activity.  Obesity. PREVENTION  Warm up and stretch properly before activity.  Allow for adequate recovery between workouts.  Maintain physical fitness:  Strength, flexibility, and endurance.  Cardiovascular fitness.  Maintain a health body weight.  Avoid stress on the plantar fascia.  Wear properly fitted shoes, including arch supports for individuals who have flat feet. PROGNOSIS  If treated properly, then the symptoms of plantar fasciitis usually resolve without surgery. However, occasionally surgery is necessary. RELATED COMPLICATIONS   Recurrent symptoms that may result in a chronic condition.  Problems of the lower back that are caused by compensating for the injury, such as limping.  Pain or weakness of the foot during push-off following surgery.  Chronic inflammation, scarring, and partial or complete fascia tear, occurring more often from repeated injections. TREATMENT  Treatment initially involves the use of  ice and medication to help reduce pain and inflammation. The use of strengthening and stretching exercises may help reduce pain with activity, especially stretches of the Achilles tendon. These exercises may be performed at home or with a therapist. Your caregiver may recommend that you use heel cups of arch supports to help reduce stress on the plantar fascia. Occasionally, corticosteroid injections are given to reduce inflammation. If symptoms persist for greater than 6 months despite non-surgical (conservative), then surgery may be recommended.  MEDICATION   If pain medication is necessary, then nonsteroidal anti-inflammatory medications, such as aspirin and ibuprofen, or other minor pain relievers, such as acetaminophen, are often recommended.  Do not take pain medication within 7 days before surgery.  Prescription pain relievers may be given if deemed necessary by your caregiver. Use only as directed and only as much as you need.  Corticosteroid injections may be given by your caregiver. These injections should be reserved for the most serious cases, because they may only be given a certain number of times. HEAT AND COLD  Cold treatment (icing) relieves pain and reduces inflammation. Cold treatment should be applied for 10 to 15 minutes every 2 to 3 hours for inflammation and pain and immediately after any activity that aggravates your symptoms. Use ice packs or massage the area with a piece of ice (ice massage).  Heat treatment may be used prior to performing the stretching and strengthening activities prescribed by your caregiver, physical therapist, or athletic trainer. Use a heat pack or soak the injury in warm water. SEEK IMMEDIATE MEDICAL CARE IF:  Treatment seems to offer no benefit, or the condition worsens.  Any medications produce adverse side effects. EXERCISES RANGE   OF MOTION (ROM) AND STRETCHING EXERCISES - Plantar Fasciitis (Heel Spur Syndrome) These exercises may help you  when beginning to rehabilitate your injury. Your symptoms may resolve with or without further involvement from your physician, physical therapist or athletic trainer. While completing these exercises, remember:   Restoring tissue flexibility helps normal motion to return to the joints. This allows healthier, less painful movement and activity.  An effective stretch should be held for at least 30 seconds.  A stretch should never be painful. You should only feel a gentle lengthening or release in the stretched tissue. RANGE OF MOTION - Toe Extension, Flexion  Sit with your right / left leg crossed over your opposite knee.  Grasp your toes and gently pull them back toward the top of your foot. You should feel a stretch on the bottom of your toes and/or foot.  Hold this stretch for __________ seconds.  Now, gently pull your toes toward the bottom of your foot. You should feel a stretch on the top of your toes and or foot.  Hold this stretch for __________ seconds. Repeat __________ times. Complete this stretch __________ times per day.  RANGE OF MOTION - Ankle Dorsiflexion, Active Assisted  Remove shoes and sit on a chair that is preferably not on a carpeted surface.  Place right / left foot under knee. Extend your opposite leg for support.  Keeping your heel down, slide your right / left foot back toward the chair until you feel a stretch at your ankle or calf. If you do not feel a stretch, slide your bottom forward to the edge of the chair, while still keeping your heel down.  Hold this stretch for __________ seconds. Repeat __________ times. Complete this stretch __________ times per day.  STRETCH - Gastroc, Standing  Place hands on wall.  Extend right / left leg, keeping the front knee somewhat bent.  Slightly point your toes inward on your back foot.  Keeping your right / left heel on the floor and your knee straight, shift your weight toward the wall, not allowing your back to  arch.  You should feel a gentle stretch in the right / left calf. Hold this position for __________ seconds. Repeat __________ times. Complete this stretch __________ times per day. STRETCH - Soleus, Standing  Place hands on wall.  Extend right / left leg, keeping the other knee somewhat bent.  Slightly point your toes inward on your back foot.  Keep your right / left heel on the floor, bend your back knee, and slightly shift your weight over the back leg so that you feel a gentle stretch deep in your back calf.  Hold this position for __________ seconds. Repeat __________ times. Complete this stretch __________ times per day. STRETCH - Gastrocsoleus, Standing  Note: This exercise can place a lot of stress on your foot and ankle. Please complete this exercise only if specifically instructed by your caregiver.   Place the ball of your right / left foot on a step, keeping your other foot firmly on the same step.  Hold on to the wall or a rail for balance.  Slowly lift your other foot, allowing your body weight to press your heel down over the edge of the step.  You should feel a stretch in your right / left calf.  Hold this position for __________ seconds.  Repeat this exercise with a slight bend in your right / left knee. Repeat __________ times. Complete this stretch __________ times per day.    STRENGTHENING EXERCISES - Plantar Fasciitis (Heel Spur Syndrome)  These exercises may help you when beginning to rehabilitate your injury. They may resolve your symptoms with or without further involvement from your physician, physical therapist or athletic trainer. While completing these exercises, remember:   Muscles can gain both the endurance and the strength needed for everyday activities through controlled exercises.  Complete these exercises as instructed by your physician, physical therapist or athletic trainer. Progress the resistance and repetitions only as guided. STRENGTH -  Towel Curls  Sit in a chair positioned on a non-carpeted surface.  Place your foot on a towel, keeping your heel on the floor.  Pull the towel toward your heel by only curling your toes. Keep your heel on the floor.  If instructed by your physician, physical therapist or athletic trainer, add ____________________ at the end of the towel. Repeat __________ times. Complete this exercise __________ times per day. STRENGTH - Ankle Inversion  Secure one end of a rubber exercise band/tubing to a fixed object (table, pole). Loop the other end around your foot just before your toes.  Place your fists between your knees. This will focus your strengthening at your ankle.  Slowly, pull your big toe up and in, making sure the band/tubing is positioned to resist the entire motion.  Hold this position for __________ seconds.  Have your muscles resist the band/tubing as it slowly pulls your foot back to the starting position. Repeat __________ times. Complete this exercises __________ times per day.  Document Released: 01/04/2005 Document Revised: 03/29/2011 Document Reviewed: 04/18/2008 [employer] Patient Information 2015 [employer]. This information is not intended to replace advice given to you by your health care provider. Make sure you discuss any questions you have with your health care provider.  

## 2013-12-28 NOTE — Progress Notes (Signed)
   Subjective:    Patient ID: [name] Rosales, female    DOB: 12/08/1990, 23 y.o.   MRN: [id number]  HPI 23 year old female presents the office today with complaints of bilateral heel pain which has been ongoing for approximate 5 months. She states that she has pain consistently throughout the day. She does that she gets some increase in symptoms after periods of rest. She has tried ice/heat and stretching without any resolution of symptoms. She denies any recent injury or trauma to the area. No other complaints at this time.  The patient is 5 months pregnant.  Review of Systems  Musculoskeletal: Positive for myalgias and joint swelling.  All other systems reviewed and are negative.      Objective:   Physical Exam AAO x3, NAD DP/PT pulses palpable bilaterally, CRT less than 3 seconds Protective sensation intact with Simms Weinstein monofilament, vibratory sensation intact, Achilles tendon reflex intact Tenderness palpation overlying the plantar aspect of bilateral calcaneus at the insertion of the plantar fascia. There is no pain within the arch of the foot along the course of plantar fascial in the plantar fascia appears to be intact. There is no pain with lateral compression of the calcaneus or pain with vibratory sensation. No pain along the posterior aspect of the calcaneus or along the course/insertion of the Achilles tendon. No overlying edema, erythema, increase in warmth. Mild equinus present. There are no areas of pinpoint bony tenderness or pain with vibratory sensation. MMT 5/5, ROM WNL There is no pain with calf compression, swelling, warmth, erythema. No open lesions or pre-ulcerative lesions      Assessment & Plan:  23 year old female with bilateral heel pain, likely plantar fasciitis. -Unable to obtain x-rays at the patient's pregnant and she denies any history of trauma or injury to the area. -Treatment options were discussed including alternatives, risks,  complications. -Discussed with the patient most likely etiologies plantar fasciitis although discussed other possibilities of heel pain. -Discussed stretching exercises -Ice to the area -Dispensed plantar fascial brace -Discussed night splint for which the patient will purchase OTC -We'll hold off on any medication at this time as the patient is pregnant. -Follow-up in 3 weeks or sooner should any palms arise. In the meantime, call the office with any questions, concerns, change in symptoms.

## 2014-01-16 ENCOUNTER — Ambulatory Visit: Payer: 59 | Admitting: Podiatry

## 2014-01-30 ENCOUNTER — Ambulatory Visit (INDEPENDENT_AMBULATORY_CARE_PROVIDER_SITE_OTHER): Payer: 59 | Admitting: Podiatry

## 2014-01-30 ENCOUNTER — Encounter: Payer: Self-pay | Admitting: Podiatry

## 2014-01-30 VITALS — BP 109/65 | HR 89 | Resp 18

## 2014-01-30 DIAGNOSIS — M722 Plantar fascial fibromatosis: Secondary | ICD-10-CM

## 2014-01-30 NOTE — Progress Notes (Signed)
Patient ID: [name] Rosales, female   DOB: 11/18/1990, 24 y.o.   MRN: [id number]  Subjective: 24 year old female returns the office today with her husband for follow-up evaluation of bilateral heel pain, likely plantar fasciitis. Since last up limit she states that she has continued to stretch, ice, wear supportive shoes. She said the plantar fascial braces did not really help and actually seemed to make the symptoms worse so she discontinued wearing them. She does state she has had some more time off work recently and her heels are not hurting currently. Denies any systemic complaints such as fevers, chills, nausea, vomiting. No acute changes since last appointment, and no other complaints at this time.   Objective: AAO x3, NAD DP/PT pulses palpable bilaterally, CRT less than 3 seconds Protective sensation intact with Simms Weinstein monofilament, vibratory sensation intact, Achilles tendon reflex intact Currently, there is no tenderness palpation overlying the plantar medial tubercle of the calcaneus at the insertion of the plantar fascia. There is no pain on the course of plantar fascial in the arch of the foot. No pain with lateral compression of the calcaneus or pain with vibratory sensation. No pain on the posterior aspect of the calcaneus or along the course/insertion of the Achilles tendon. No areas of pinpoint bony tenderness or pain with vibratory sensation to bilateral lower extremities.Marland Kitchen. MMT 5/5, ROM WNL. No edema, erythema, increase in warmth to bilateral lower extremities.  No open lesions or pre-ulcerative lesions.  No pain with calf compression, swelling, warmth, erythema  Assessment: 24 year old female with resolved bilateral heel pain, likely plantar fasciitis; 6 months pregnant  Plan: -All treatment options discussed with the patient including all alternatives, risks, complications.  -At this time as her symptoms resolve discussed treatment options to help prevent the  reoccurrence of symptoms. Recommended her to continue with stretching, icing, shoe gear modifications, orthotics. Discussed that if symptoms start to recur can apply a plantar fascial taping. Also consider physical therapy if needed. -Follow-up as needed. Encouraged the patient to call the questions, concerns, change in symptoms.

## 2014-01-30 NOTE — Patient Instructions (Signed)
Plantar Fasciitis (Heel Spur Syndrome) with Rehab The plantar fascia is a fibrous, ligament-like, soft-tissue structure that spans the bottom of the foot. Plantar fasciitis is a condition that causes pain in the foot due to inflammation of the tissue. SYMPTOMS   Pain and tenderness on the underneath side of the foot.  Pain that worsens with standing or walking. CAUSES  Plantar fasciitis is caused by irritation and injury to the plantar fascia on the underneath side of the foot. Common mechanisms of injury include:  Direct trauma to bottom of the foot.  Damage to a small nerve that runs under the foot where the main fascia attaches to the heel bone.  Stress placed on the plantar fascia due to bone spurs. RISK INCREASES WITH:   Activities that place stress on the plantar fascia (running, jumping, pivoting, or cutting).  Poor strength and flexibility.  Improperly fitted shoes.  Tight calf muscles.  Flat feet.  Failure to warm-up properly before activity.  Obesity. PREVENTION  Warm up and stretch properly before activity.  Allow for adequate recovery between workouts.  Maintain physical fitness:  Strength, flexibility, and endurance.  Cardiovascular fitness.  Maintain a health body weight.  Avoid stress on the plantar fascia.  Wear properly fitted shoes, including arch supports for individuals who have flat feet. PROGNOSIS  If treated properly, then the symptoms of plantar fasciitis usually resolve without surgery. However, occasionally surgery is necessary. RELATED COMPLICATIONS   Recurrent symptoms that may result in a chronic condition.  Problems of the lower back that are caused by compensating for the injury, such as limping.  Pain or weakness of the foot during push-off following surgery.  Chronic inflammation, scarring, and partial or complete fascia tear, occurring more often from repeated injections. TREATMENT  Treatment initially involves the use of  ice and medication to help reduce pain and inflammation. The use of strengthening and stretching exercises may help reduce pain with activity, especially stretches of the Achilles tendon. These exercises may be performed at home or with a therapist. Your caregiver may recommend that you use heel cups of arch supports to help reduce stress on the plantar fascia. Occasionally, corticosteroid injections are given to reduce inflammation. If symptoms persist for greater than 6 months despite non-surgical (conservative), then surgery may be recommended.  MEDICATION   If pain medication is necessary, then nonsteroidal anti-inflammatory medications, such as aspirin and ibuprofen, or other minor pain relievers, such as acetaminophen, are often recommended.  Do not take pain medication within 7 days before surgery.  Prescription pain relievers may be given if deemed necessary by your caregiver. Use only as directed and only as much as you need.  Corticosteroid injections may be given by your caregiver. These injections should be reserved for the most serious cases, because they may only be given a certain number of times. HEAT AND COLD  Cold treatment (icing) relieves pain and reduces inflammation. Cold treatment should be applied for 10 to 15 minutes every 2 to 3 hours for inflammation and pain and immediately after any activity that aggravates your symptoms. Use ice packs or massage the area with a piece of ice (ice massage).  Heat treatment may be used prior to performing the stretching and strengthening activities prescribed by your caregiver, physical therapist, or athletic trainer. Use a heat pack or soak the injury in warm water. SEEK IMMEDIATE MEDICAL CARE IF:  Treatment seems to offer no benefit, or the condition worsens.  Any medications produce adverse side effects. EXERCISES RANGE   OF MOTION (ROM) AND STRETCHING EXERCISES - Plantar Fasciitis (Heel Spur Syndrome) These exercises may help you  when beginning to rehabilitate your injury. Your symptoms may resolve with or without further involvement from your physician, physical therapist or athletic trainer. While completing these exercises, remember:   Restoring tissue flexibility helps normal motion to return to the joints. This allows healthier, less painful movement and activity.  An effective stretch should be held for at least 30 seconds.  A stretch should never be painful. You should only feel a gentle lengthening or release in the stretched tissue. RANGE OF MOTION - Toe Extension, Flexion  Sit with your right / left leg crossed over your opposite knee.  Grasp your toes and gently pull them back toward the top of your foot. You should feel a stretch on the bottom of your toes and/or foot.  Hold this stretch for __________ seconds.  Now, gently pull your toes toward the bottom of your foot. You should feel a stretch on the top of your toes and or foot.  Hold this stretch for __________ seconds. Repeat __________ times. Complete this stretch __________ times per day.  RANGE OF MOTION - Ankle Dorsiflexion, Active Assisted  Remove shoes and sit on a chair that is preferably not on a carpeted surface.  Place right / left foot under knee. Extend your opposite leg for support.  Keeping your heel down, slide your right / left foot back toward the chair until you feel a stretch at your ankle or calf. If you do not feel a stretch, slide your bottom forward to the edge of the chair, while still keeping your heel down.  Hold this stretch for __________ seconds. Repeat __________ times. Complete this stretch __________ times per day.  STRETCH - Gastroc, Standing  Place hands on wall.  Extend right / left leg, keeping the front knee somewhat bent.  Slightly point your toes inward on your back foot.  Keeping your right / left heel on the floor and your knee straight, shift your weight toward the wall, not allowing your back to  arch.  You should feel a gentle stretch in the right / left calf. Hold this position for __________ seconds. Repeat __________ times. Complete this stretch __________ times per day. STRETCH - Soleus, Standing  Place hands on wall.  Extend right / left leg, keeping the other knee somewhat bent.  Slightly point your toes inward on your back foot.  Keep your right / left heel on the floor, bend your back knee, and slightly shift your weight over the back leg so that you feel a gentle stretch deep in your back calf.  Hold this position for __________ seconds. Repeat __________ times. Complete this stretch __________ times per day. STRETCH - Gastrocsoleus, Standing  Note: This exercise can place a lot of stress on your foot and ankle. Please complete this exercise only if specifically instructed by your caregiver.   Place the ball of your right / left foot on a step, keeping your other foot firmly on the same step.  Hold on to the wall or a rail for balance.  Slowly lift your other foot, allowing your body weight to press your heel down over the edge of the step.  You should feel a stretch in your right / left calf.  Hold this position for __________ seconds.  Repeat this exercise with a slight bend in your right / left knee. Repeat __________ times. Complete this stretch __________ times per day.    STRENGTHENING EXERCISES - Plantar Fasciitis (Heel Spur Syndrome)  These exercises may help you when beginning to rehabilitate your injury. They may resolve your symptoms with or without further involvement from your physician, physical therapist or athletic trainer. While completing these exercises, remember:   Muscles can gain both the endurance and the strength needed for everyday activities through controlled exercises.  Complete these exercises as instructed by your physician, physical therapist or athletic trainer. Progress the resistance and repetitions only as guided. STRENGTH -  Towel Curls  Sit in a chair positioned on a non-carpeted surface.  Place your foot on a towel, keeping your heel on the floor.  Pull the towel toward your heel by only curling your toes. Keep your heel on the floor.  If instructed by your physician, physical therapist or athletic trainer, add ____________________ at the end of the towel. Repeat __________ times. Complete this exercise __________ times per day. STRENGTH - Ankle Inversion  Secure one end of a rubber exercise band/tubing to a fixed object (table, pole). Loop the other end around your foot just before your toes.  Place your fists between your knees. This will focus your strengthening at your ankle.  Slowly, pull your big toe up and in, making sure the band/tubing is positioned to resist the entire motion.  Hold this position for __________ seconds.  Have your muscles resist the band/tubing as it slowly pulls your foot back to the starting position. Repeat __________ times. Complete this exercises __________ times per day.  Document Released: 01/04/2005 Document Revised: 03/29/2011 Document Reviewed: 04/18/2008 [employer] Patient Information 2015 [employer]. This information is not intended to replace advice given to you by your health care provider. Make sure you discuss any questions you have with your health care provider.  

## 2014-03-18 ENCOUNTER — Other Ambulatory Visit: Payer: Self-pay

## 2014-03-18 LAB — OB RESULTS CONSOLE GBS: STREP GROUP B AG: NEGATIVE

## 2014-03-30 ENCOUNTER — Encounter (HOSPITAL_COMMUNITY): Payer: Self-pay | Admitting: *Deleted

## 2014-03-30 ENCOUNTER — Inpatient Hospital Stay (HOSPITAL_COMMUNITY)
Admission: AD | Admit: 2014-03-30 | Discharge: 2014-03-30 | Disposition: A | Discharge status: Home / Self Care | Payer: 59 | Source: Ambulatory Visit | Attending: Obstetrics & Gynecology | Admitting: Obstetrics & Gynecology

## 2014-03-30 DIAGNOSIS — O2343 Unspecified infection of urinary tract in pregnancy, third trimester: Secondary | ICD-10-CM | POA: Insufficient documentation

## 2014-03-30 DIAGNOSIS — N859 Noninflammatory disorder of uterus, unspecified: Secondary | ICD-10-CM

## 2014-03-30 DIAGNOSIS — O26899 Other specified pregnancy related conditions, unspecified trimester: Secondary | ICD-10-CM

## 2014-03-30 DIAGNOSIS — O9989 Other specified diseases and conditions complicating pregnancy, childbirth and the puerperium: Secondary | ICD-10-CM | POA: Diagnosis not present

## 2014-03-30 DIAGNOSIS — R109 Unspecified abdominal pain: Secondary | ICD-10-CM | POA: Diagnosis present

## 2014-03-30 DIAGNOSIS — Z3A37 37 weeks gestation of pregnancy: Secondary | ICD-10-CM | POA: Insufficient documentation

## 2014-03-30 DIAGNOSIS — N390 Urinary tract infection, site not specified: Secondary | ICD-10-CM

## 2014-03-30 DIAGNOSIS — M545 Low back pain: Secondary | ICD-10-CM | POA: Diagnosis not present

## 2014-03-30 DIAGNOSIS — N858 Other specified noninflammatory disorders of uterus: Secondary | ICD-10-CM

## 2014-03-30 LAB — URINE MICROSCOPIC-ADD ON

## 2014-03-30 LAB — URINALYSIS, ROUTINE W REFLEX MICROSCOPIC
Bilirubin Urine: NEGATIVE
Glucose, UA: NEGATIVE mg/dL
Ketones, ur: NEGATIVE mg/dL
NITRITE: POSITIVE — AB
Protein, ur: 100 mg/dL — AB
SPECIFIC GRAVITY, URINE: 1.025 (ref 1.005–1.030)
UROBILINOGEN UA: 0.2 mg/dL (ref 0.0–1.0)
pH: 6 (ref 5.0–8.0)

## 2014-03-30 MED ORDER — ACETAMINOPHEN-CODEINE #3 300-30 MG PO TABS
2.0000 | ORAL_TABLET | Freq: Once | ORAL | Status: AC
  Administered 2014-03-30: 2 via ORAL
  Filled 2014-03-30: qty 2

## 2014-03-30 MED ORDER — NITROFURANTOIN MONOHYD MACRO 100 MG PO CAPS: 100.0000 mg | ORAL_CAPSULE | Freq: Two times a day (BID) | ORAL | Status: DC

## 2014-03-30 MED ORDER — NITROFURANTOIN MONOHYD MACRO 100 MG PO CAPS
100.0000 mg | ORAL_CAPSULE | Freq: Once | ORAL | Status: AC
  Administered 2014-03-30: 100 mg via ORAL
  Filled 2014-03-30: qty 1

## 2014-03-30 NOTE — Discharge Instructions (Signed)

## 2014-03-30 NOTE — MAU Note (Addendum)
Patient states she started having severe abdominal cramping starting around 2200 and says she thinks she may be in labor.  She denies vaginal bleeding or leaking of fluids.  States baby is moving well.

## 2014-03-30 NOTE — MAU Provider Note (Signed)
Subjective:  [name] Rosales is a 24 y.o. female G3P0020 at 4630w1d who presents with abdominal cramping; she came in because she thought she was in labor. The pain is located in her lower stomach and feels like menstrual cramps. She is also having bilateral lower back pain.   I was called by the RN due to concerns of patients UA results   Denies fever Denies vaginal bleeding  Denies dysuria  Denies HA Denies Blurred vision  + fetal movement     RN:   Expand All Collapse All   Patient states she started having severe abdominal cramping starting around 2200 and says she thinks she may be in labor. She denies vaginal bleeding or leaking of fluids. States baby is moving well.       Objective:  GENERAL: Well-developed, well-nourished female in no acute distress.  HEENT: Normocephalic, atraumatic.   LUNGS: Effort normal ABDOMEN: Soft, non tender, negative flank pain  SKIN: Warm, dry and without erythema PSYCH: Normal mood and affect  Filed Vitals:   03/30/14 0226  BP: 125/70  Pulse: 107  Temp: 98.2 F (36.8 C)  Resp: 20   Results for orders placed or performed during the hospital encounter of 03/30/14 (from the past 48 hour(s))  Urinalysis, Routine w reflex microscopic     Status: Abnormal   Collection Time: 03/30/14  2:30 AM  Result Value Ref Range   Color, Urine YELLOW YELLOW   APPearance HAZY (A) CLEAR   Specific Gravity, Urine 1.025 1.005 - 1.030   pH 6.0 5.0 - 8.0   Glucose, UA NEGATIVE NEGATIVE mg/dL   Hgb urine dipstick SMALL (A) NEGATIVE   Bilirubin Urine NEGATIVE NEGATIVE   Ketones, ur NEGATIVE NEGATIVE mg/dL   Protein, ur 696100 (A) NEGATIVE mg/dL   Urobilinogen, UA 0.2 0.0 - 1.0 mg/dL   Nitrite POSITIVE (A) NEGATIVE   Leukocytes, UA MODERATE (A) NEGATIVE  Urine microscopic-add on     Status: Abnormal   Collection Time: 03/30/14  2:30 AM  Result Value Ref Range   Squamous Epithelial / LPF RARE RARE   WBC, UA 3-6 <3 WBC/hpf   RBC / HPF 0-2 <3  RBC/hpf   Bacteria, UA FEW (A) RARE   MDM:  Dilation: 1 Effacement (%): Thick Cervical Position: Posterior Exam by:: [name] Rosales, rnc  Fetal Tracing: Baseline: 135 bpm  Variability: Moderate  Accelerations: 15x15 Decelerations: none Toco: UI   Urine culture pending  Tylenol #3 Macrobid Discussed patient with Dr. Mora ApplPinn.    Assessment:  Uterine irritability UTI; culture pending  Abdominal pain in pregnancy    Plan:   Discharge home in stable condition Labor precautions Kick counts  Follow up with OB as scheduled Return to MAU if symptoms worsen; including fever.     Duane LopeJennifer I Linnette Panella, NP 03/30/2014 3:41 AM

## 2014-04-01 LAB — URINE CULTURE
Colony Count: >=100000
Special Requests: NORMAL

## 2014-04-09 ENCOUNTER — Encounter (HOSPITAL_COMMUNITY): Payer: Self-pay | Admitting: *Deleted

## 2014-04-09 ENCOUNTER — Inpatient Hospital Stay (HOSPITAL_COMMUNITY)
Admission: AD | Admit: 2014-04-09 | Discharge: 2014-04-09 | Disposition: A | Discharge status: Home / Self Care | Payer: 59 | Source: Ambulatory Visit | Attending: Obstetrics & Gynecology | Admitting: Obstetrics & Gynecology

## 2014-04-09 DIAGNOSIS — Z3689 Encounter for other specified antenatal screening: Secondary | ICD-10-CM

## 2014-04-09 DIAGNOSIS — Z36 Encounter for antenatal screening of mother: Secondary | ICD-10-CM | POA: Diagnosis not present

## 2014-04-09 DIAGNOSIS — Z3A38 38 weeks gestation of pregnancy: Secondary | ICD-10-CM | POA: Diagnosis not present

## 2014-04-09 DIAGNOSIS — O368130 Decreased fetal movements, third trimester, not applicable or unspecified: Secondary | ICD-10-CM | POA: Insufficient documentation

## 2014-04-09 NOTE — MAU Note (Signed)
Pt reports that at 9pm she realized that she really hasn't felt the baby moving much today. She ate and did a kick count at home. But she only felt the baby move 1-2 times.

## 2014-04-09 NOTE — MAU Provider Note (Signed)
History     CSN: [id number]  Arrival date and time: 04/09/14 0116   First Provider Initiated Contact with Patient 04/09/14 0131      Chief Complaint  Patient presents with  . Decreased Fetal Movement   HPI  [name] is a 24 y.o. G3P0020 at 6465w4d who presents to MAU today with complaint of decreased fetal movement since earlier today. She states that after work she ate and drank and noted ~ 8 movements over 2 hours. She denies vaginal bleeding, abnormal discharge or LOF. She noted ~ 2 contractions today. She denies complications with the pregnancy.    OB History    Gravida Para Term Preterm AB TAB SAB Ectopic Multiple Living   3    2  2    0      Past Medical History  Diagnosis Date  . Medical history non-contributory   . Anemia     Past Surgical History  Procedure Laterality Date  . Dilation and evacuation N/A 11/20/2012    Procedure: DILATATION AND EVACUATION WITH ULTRASOUND BEFORE SURGERY;  Surgeon: Freddrick MarchKendra H. Tenny Crawoss, MD;  Location: [facility];  Service: Gynecology;  Laterality: N/A;  . Wrist surgery Left     To remove a cyst  . Cystoscopy      Age 24  . Dilation and evacuation N/A 07/13/2013    Procedure: DILATATION AND EVACUATION;  Surgeon: Loney LaurenceMichelle A Horvath, MD;  Location: [facility];  Service: Gynecology;  Laterality: N/A;    No family history on file.  History  Substance Use Topics  . Smoking status: Never Smoker   . Smokeless tobacco: Never Used  . Alcohol Use: No     Comment: occas. prior to preg.    Allergies: No Known Allergies  Prescriptions prior to admission  Medication Sig Dispense Refill Last Dose  . cetirizine (ZYRTEC) 10 MG tablet    Past Month at Unknown time  . nitrofurantoin, macrocrystal-monohydrate, (MACROBID) 100 MG capsule Take 1 capsule (100 mg total) by mouth 2 (two) times daily. 9 capsule 0 Past Week at Unknown time  . Prenatal Vit-Fe Fumarate-FA (PRENATAL MULTIVITAMIN) TABS tablet Take 1 tablet by mouth daily at 12 noon.   Past  Month at Unknown time    Review of Systems  Constitutional: Negative for malaise/fatigue.  Gastrointestinal: Negative for abdominal pain.  Genitourinary:       Neg - vaginal bleeding, discharge, LOF   Physical Exam   Blood pressure 124/75, pulse 87, temperature 98.4 F (36.9 C), resp. rate 18.  Physical Exam  Constitutional: She is oriented to person, place, and time. She appears well-developed and well-nourished. No distress.  HENT:  Head: Normocephalic.  Cardiovascular: Normal rate.   Respiratory: Effort normal.  GI: Soft. She exhibits no distension and no mass. There is no tenderness. There is no rebound and no guarding.  Neurological: She is alert and oriented to person, place, and time.  Skin: Skin is warm and dry. No erythema.  Psychiatric: She has a normal mood and affect.    Fetal Monitoring: Baseline: 135 bpm, moderate variability, + accelerations, no decelerations Contractions: moderate UI, none  MAU Course  Procedures None  MDM Discussed patient with Dr. Mora ApplPinn. Ok for discharge at this time. Follow-up as scheduled.   Assessment and Plan  A: SIUP at 4065w4d Reactive NST  P: Discharge home Kick counts and labor precautions discussed Patient advised to follow-up with Dr. Mora ApplPinn as scheduled for routine prenatal care or sooner if symptoms worsen Patient may  return to MAU as needed or if her condition were to change or worsen   Marny Lowenstein, PA-C  04/09/2014, 2:10 AM

## 2014-04-09 NOTE — Discharge Instructions (Signed)
Braxton Hicks Contractions °Contractions of the uterus can occur throughout pregnancy. Contractions are not always a sign that you are in labor.  °WHAT ARE BRAXTON HICKS CONTRACTIONS?  °Contractions that occur before labor are called Braxton Hicks contractions, or false labor. Toward the end of pregnancy (32-34 weeks), these contractions can develop more often and may become more forceful. This is not true labor because these contractions do not result in opening (dilatation) and thinning of the cervix. They are sometimes difficult to tell apart from true labor because these contractions can be forceful and people have different pain tolerances. You should not feel embarrassed if you go to the hospital with false labor. Sometimes, the only way to tell if you are in true labor is for your health care provider to look for changes in the cervix. °If there are no prenatal problems or other health problems associated with the pregnancy, it is completely safe to be sent home with false labor and await the onset of true labor. °HOW CAN YOU TELL THE DIFFERENCE BETWEEN TRUE AND FALSE LABOR? °False Labor °· The contractions of false labor are usually shorter and not as hard as those of true labor.   °· The contractions are usually irregular.   °· The contractions are often felt in the front of the lower abdomen and in the groin.   °· The contractions may go away when you walk around or change positions while lying down.   °· The contractions get weaker and are shorter lasting as time goes on.   °· The contractions do not usually become progressively stronger, regular, and closer together as with true labor.   °True Labor °· Contractions in true labor last 30-70 seconds, become very regular, usually become more intense, and increase in frequency.   °· The contractions do not go away with walking.   °· The discomfort is usually felt in the top of the uterus and spreads to the lower abdomen and low back.   °· True labor can be  determined by your health care provider with an exam. This will show that the cervix is dilating and getting thinner.   °WHAT TO REMEMBER °· Keep up with your usual exercises and follow other instructions given by your health care provider.   °· Take medicines as directed by your health care provider.   °· Keep your regular prenatal appointments.   °· Eat and drink lightly if you think you are going into labor.   °· If Braxton Hicks contractions are making you uncomfortable:   °¨ Change your position from lying down or resting to walking, or from walking to resting.   °¨ Sit and rest in a tub of warm water.   °¨ Drink 2-3 glasses of water. Dehydration may cause these contractions.   °¨ Do slow and deep breathing several times an hour.   °WHEN SHOULD I SEEK IMMEDIATE MEDICAL CARE? °Seek immediate medical care if: °· Your contractions become stronger, more regular, and closer together.   °· You have fluid leaking or gushing from your vagina.   °· You have a fever.   °· You pass blood-tinged mucus.   °· You have vaginal bleeding.   °· You have continuous abdominal pain.   °· You have low back pain that you never had before.   °· You feel your baby's head pushing down and causing pelvic pressure.   °· Your baby is not moving as much as it used to.   °Document Released: 01/04/2005 Document Revised: 01/09/2013 Document Reviewed: 10/16/2012 °[employer]® Patient Information ©2015 [employer]. This information is not intended to replace advice given to you by your health care   provider. Make sure you discuss any questions you have with your health care provider. °Fetal Movement Counts °Patient Name: __________________________________________________ Patient Due Date: ____________________ °Performing a fetal movement count is highly recommended in high-risk pregnancies, but it is good for every pregnant woman to do. Your health care provider may ask you to start counting fetal movements at 28 weeks of the pregnancy. Fetal  movements often increase: °· After eating a full meal. °· After physical activity. °· After eating or drinking something sweet or cold. °· At rest. °Pay attention to when you feel the baby is most active. This will help you notice a pattern of your baby's sleep and wake cycles and what factors contribute to an increase in fetal movement. It is important to perform a fetal movement count at the same time each day when your baby is normally most active.  °HOW TO COUNT FETAL MOVEMENTS °· Find a quiet and comfortable area to sit or lie down on your left side. Lying on your left side provides the best blood and oxygen circulation to your baby. °· Write down the day and time on a sheet of paper or in a journal. °· Start counting kicks, flutters, swishes, rolls, or jabs in a 2-hour period. You should feel at least 10 movements within 2 hours. °· If you do not feel 10 movements in 2 hours, wait 2-3 hours and count again. Look for a change in the pattern or not enough counts in 2 hours. °SEEK MEDICAL CARE IF: °· You feel less than 10 counts in 2 hours, tried twice. °· There is no movement in over an hour. °· The pattern is changing or taking longer each day to reach 10 counts in 2 hours. °· You feel the baby is not moving as he or she usually does. °Date: ____________ Movements: ____________ Start time: ____________ Finish time: ____________  °Date: ____________ Movements: ____________ Start time: ____________ Finish time: ____________ °Date: ____________ Movements: ____________ Start time: ____________ Finish time: ____________ °Date: ____________ Movements: ____________ Start time: ____________ Finish time: ____________ °Date: ____________ Movements: ____________ Start time: ____________ Finish time: ____________ °Date: ____________ Movements: ____________ Start time: ____________ Finish time: ____________ °Date: ____________ Movements: ____________ Start time: ____________ Finish time: ____________ °Date: ____________  Movements: ____________ Start time: ____________ Finish time: ____________  °Date: ____________ Movements: ____________ Start time: ____________ Finish time: ____________ °Date: ____________ Movements: ____________ Start time: ____________ Finish time: ____________ °Date: ____________ Movements: ____________ Start time: ____________ Finish time: ____________ °Date: ____________ Movements: ____________ Start time: ____________ Finish time: ____________ °Date: ____________ Movements: ____________ Start time: ____________ Finish time: ____________ °Date: ____________ Movements: ____________ Start time: ____________ Finish time: ____________ °Date: ____________ Movements: ____________ Start time: ____________ Finish time: ____________  °Date: ____________ Movements: ____________ Start time: ____________ Finish time: ____________ °Date: ____________ Movements: ____________ Start time: ____________ Finish time: ____________ °Date: ____________ Movements: ____________ Start time: ____________ Finish time: ____________ °Date: ____________ Movements: ____________ Start time: ____________ Finish time: ____________ °Date: ____________ Movements: ____________ Start time: ____________ Finish time: ____________ °Date: ____________ Movements: ____________ Start time: ____________ Finish time: ____________ °Date: ____________ Movements: ____________ Start time: ____________ Finish time: ____________  °Date: ____________ Movements: ____________ Start time: ____________ Finish time: ____________ °Date: ____________ Movements: ____________ Start time: ____________ Finish time: ____________ °Date: ____________ Movements: ____________ Start time: ____________ Finish time: ____________ °Date: ____________ Movements: ____________ Start time: ____________ Finish time: ____________ °Date: ____________ Movements: ____________ Start time: ____________ Finish time: ____________ °Date: ____________ Movements: ____________ Start time:  ____________ Finish time: ____________ °Date: ____________ Movements: ____________   Start time: ____________ Finish time: ____________  °Date: ____________ Movements: ____________ Start time: ____________ Finish time: ____________ °Date: ____________ Movements: ____________ Start time: ____________ Finish time: ____________ °Date: ____________ Movements: ____________ Start time: ____________ Finish time: ____________ °Date: ____________ Movements: ____________ Start time: ____________ Finish time: ____________ °Date: ____________ Movements: ____________ Start time: ____________ Finish time: ____________ °Date: ____________ Movements: ____________ Start time: ____________ Finish time: ____________ °Date: ____________ Movements: ____________ Start time: ____________ Finish time: ____________  °Date: ____________ Movements: ____________ Start time: ____________ Finish time: ____________ °Date: ____________ Movements: ____________ Start time: ____________ Finish time: ____________ °Date: ____________ Movements: ____________ Start time: ____________ Finish time: ____________ °Date: ____________ Movements: ____________ Start time: ____________ Finish time: ____________ °Date: ____________ Movements: ____________ Start time: ____________ Finish time: ____________ °Date: ____________ Movements: ____________ Start time: ____________ Finish time: ____________ °Date: ____________ Movements: ____________ Start time: ____________ Finish time: ____________  °Date: ____________ Movements: ____________ Start time: ____________ Finish time: ____________ °Date: ____________ Movements: ____________ Start time: ____________ Finish time: ____________ °Date: ____________ Movements: ____________ Start time: ____________ Finish time: ____________ °Date: ____________ Movements: ____________ Start time: ____________ Finish time: ____________ °Date: ____________ Movements: ____________ Start time: ____________ Finish time: ____________ °Date:  ____________ Movements: ____________ Start time: ____________ Finish time: ____________ °Date: ____________ Movements: ____________ Start time: ____________ Finish time: ____________  °Date: ____________ Movements: ____________ Start time: ____________ Finish time: ____________ °Date: ____________ Movements: ____________ Start time: ____________ Finish time: ____________ °Date: ____________ Movements: ____________ Start time: ____________ Finish time: ____________ °Date: ____________ Movements: ____________ Start time: ____________ Finish time: ____________ °Date: ____________ Movements: ____________ Start time: ____________ Finish time: ____________ °Date: ____________ Movements: ____________ Start time: ____________ Finish time: ____________ °Document Released: 02/03/2006 Document Revised: 05/21/2013 Document Reviewed: 11/01/2011 °[employer]® Patient Information ©2015 [employer]. This information is not intended to replace advice given to you by your health care provider. Make sure you discuss any questions you have with your health care provider. ° °

## 2014-04-26 ENCOUNTER — Inpatient Hospital Stay (HOSPITAL_COMMUNITY): Payer: 59 | Admitting: Anesthesiology

## 2014-04-26 ENCOUNTER — Inpatient Hospital Stay (HOSPITAL_COMMUNITY)
Admission: AD | Admit: 2014-04-26 | Discharge: 2014-04-29 | DRG: 765 | Disposition: A | Discharge status: Home / Self Care | Payer: 59 | Source: Ambulatory Visit | Attending: Obstetrics and Gynecology | Admitting: Obstetrics and Gynecology

## 2014-04-26 ENCOUNTER — Encounter (HOSPITAL_COMMUNITY): Payer: Self-pay | Admitting: Anesthesiology

## 2014-04-26 DIAGNOSIS — O99214 Obesity complicating childbirth: Secondary | ICD-10-CM | POA: Diagnosis present

## 2014-04-26 DIAGNOSIS — Z9889 Other specified postprocedural states: Secondary | ICD-10-CM

## 2014-04-26 DIAGNOSIS — O480 Post-term pregnancy: Principal | ICD-10-CM | POA: Diagnosis present

## 2014-04-26 DIAGNOSIS — Z6841 Body Mass Index (BMI) 40.0 and over, adult: Secondary | ICD-10-CM | POA: Diagnosis not present

## 2014-04-26 DIAGNOSIS — Z3A41 41 weeks gestation of pregnancy: Secondary | ICD-10-CM | POA: Diagnosis present

## 2014-04-26 DIAGNOSIS — Z3490 Encounter for supervision of normal pregnancy, unspecified, unspecified trimester: Secondary | ICD-10-CM

## 2014-04-26 LAB — CBC
HCT: 31.7 % — ABNORMAL LOW (ref 36.0–46.0)
Hemoglobin: 10.2 g/dL — ABNORMAL LOW (ref 12.0–15.0)
MCH: 25.2 pg — ABNORMAL LOW (ref 26.0–34.0)
MCHC: 32.2 g/dL (ref 30.0–36.0)
MCV: 78.3 fL (ref 78.0–100.0)
PLATELETS: 157 10*3/uL (ref 150–400)
RBC: 4.05 MIL/uL (ref 3.87–5.11)
RDW: 16.1 % — AB (ref 11.5–15.5)
WBC: 9.1 10*3/uL (ref 4.0–10.5)

## 2014-04-26 LAB — TYPE AND SCREEN
ABO/RH(D): A POS
ANTIBODY SCREEN: NEGATIVE

## 2014-04-26 MED ORDER — PHENYLEPHRINE 40 MCG/ML (10ML) SYRINGE FOR IV PUSH (FOR BLOOD PRESSURE SUPPORT): 80.0000 ug | PREFILLED_SYRINGE | INTRAVENOUS | Status: DC | PRN

## 2014-04-26 MED ORDER — OXYCODONE-ACETAMINOPHEN 5-325 MG PO TABS: 2.0000 | ORAL_TABLET | ORAL | Status: DC | PRN

## 2014-04-26 MED ORDER — LACTATED RINGERS IV SOLN: 500.0000 mL | Freq: Once | INTRAVENOUS | Status: DC

## 2014-04-26 MED ORDER — OXYTOCIN 40 UNITS IN LACTATED RINGERS INFUSION - SIMPLE MED
1.0000 m[IU]/min | INTRAVENOUS | Status: DC
  Administered 2014-04-26: 10 m[IU]/min via INTRAVENOUS
  Administered 2014-04-26: 2 m[IU]/min via INTRAVENOUS
  Filled 2014-04-26: qty 1000

## 2014-04-26 MED ORDER — OXYTOCIN 40 UNITS IN LACTATED RINGERS INFUSION - SIMPLE MED: 62.5000 mL/h | INTRAVENOUS | Status: DC

## 2014-04-26 MED ORDER — FENTANYL 2.5 MCG/ML BUPIVACAINE 1/10 % EPIDURAL INFUSION (WH - ANES)
14.0000 mL/h | INTRAMUSCULAR | Status: DC | PRN
  Administered 2014-04-26 (×2): 14 mL/h via EPIDURAL
  Filled 2014-04-26 (×2): qty 125

## 2014-04-26 MED ORDER — CITRIC ACID-SODIUM CITRATE 334-500 MG/5ML PO SOLN
30.0000 mL | ORAL | Status: DC | PRN
  Administered 2014-04-27: 30 mL via ORAL
  Filled 2014-04-26: qty 15

## 2014-04-26 MED ORDER — ACETAMINOPHEN 325 MG PO TABS
650.0000 mg | ORAL_TABLET | ORAL | Status: DC | PRN
  Administered 2014-04-26 (×2): 650 mg via ORAL
  Filled 2014-04-26 (×2): qty 2

## 2014-04-26 MED ORDER — ONDANSETRON HCL 4 MG/2ML IJ SOLN
4.0000 mg | Freq: Four times a day (QID) | INTRAMUSCULAR | Status: DC | PRN
  Administered 2014-04-26: 4 mg via INTRAVENOUS
  Filled 2014-04-26: qty 2

## 2014-04-26 MED ORDER — OXYCODONE-ACETAMINOPHEN 5-325 MG PO TABS: 1.0000 | ORAL_TABLET | ORAL | Status: DC | PRN

## 2014-04-26 MED ORDER — EPHEDRINE 5 MG/ML INJ: 10.0000 mg | INTRAVENOUS | Status: DC | PRN

## 2014-04-26 MED ORDER — FENTANYL 2.5 MCG/ML BUPIVACAINE 1/10 % EPIDURAL INFUSION (WH - ANES)
INTRAMUSCULAR | Status: DC | PRN
Start: 2014-04-26 — End: 2014-04-27
  Administered 2014-04-26: 14 mL/h via EPIDURAL

## 2014-04-26 MED ORDER — OXYTOCIN BOLUS FROM INFUSION: 500.0000 mL | INTRAVENOUS | Status: DC

## 2014-04-26 MED ORDER — LIDOCAINE HCL (PF) 1 % IJ SOLN
30.0000 mL | INTRAMUSCULAR | Status: DC | PRN
  Filled 2014-04-26: qty 30

## 2014-04-26 MED ORDER — FENTANYL CITRATE 0.05 MG/ML IJ SOLN
100.0000 ug | Freq: Once | INTRAMUSCULAR | Status: AC
  Administered 2014-04-26: 100 ug via INTRAVENOUS
  Filled 2014-04-26: qty 2

## 2014-04-26 MED ORDER — PHENYLEPHRINE 40 MCG/ML (10ML) SYRINGE FOR IV PUSH (FOR BLOOD PRESSURE SUPPORT)
80.0000 ug | PREFILLED_SYRINGE | INTRAVENOUS | Status: DC | PRN
  Filled 2014-04-26 (×2): qty 20

## 2014-04-26 MED ORDER — SODIUM CHLORIDE 0.9 % IV SOLN
2.0000 g | Freq: Four times a day (QID) | INTRAVENOUS | Status: DC
  Administered 2014-04-26 – 2014-04-27 (×3): 2 g via INTRAVENOUS
  Filled 2014-04-26 (×6): qty 2000

## 2014-04-26 MED ORDER — LIDOCAINE HCL (PF) 1 % IJ SOLN
INTRAMUSCULAR | Status: DC | PRN
  Administered 2014-04-26 (×2): 4 mL
  Administered 2014-04-27: 5 mL

## 2014-04-26 MED ORDER — DIPHENHYDRAMINE HCL 50 MG/ML IJ SOLN: 12.5000 mg | INTRAMUSCULAR | Status: DC | PRN

## 2014-04-26 MED ORDER — LACTATED RINGERS IV SOLN
500.0000 mL | INTRAVENOUS | Status: DC | PRN
  Administered 2014-04-26: 500 mL via INTRAVENOUS

## 2014-04-26 MED ORDER — FLEET ENEMA 7-19 GM/118ML RE ENEM: 1.0000 | ENEMA | RECTAL | Status: DC | PRN

## 2014-04-26 MED ORDER — TERBUTALINE SULFATE 1 MG/ML IJ SOLN: 0.2500 mg | Freq: Once | INTRAMUSCULAR | Status: AC | PRN

## 2014-04-26 MED ORDER — LACTATED RINGERS IV SOLN
INTRAVENOUS | Status: DC
Start: 2014-04-26 — End: 2014-04-27
  Administered 2014-04-26 – 2014-04-27 (×4): via INTRAVENOUS

## 2014-04-26 NOTE — Anesthesia Preprocedure Evaluation (Signed)
Anesthesia Evaluation  Patient identified by MRN, date of birth, ID band Patient awake    Reviewed: Allergy & Precautions, H&P , Patient's Chart, lab work & pertinent test results  Airway Mallampati: III  TM Distance: >3 FB Neck ROM: full    Dental no notable dental hx. (+) Teeth Intact   Pulmonary neg pulmonary ROS,  breath sounds clear to auscultation  Pulmonary exam normal       Cardiovascular negative cardio ROS  Rhythm:regular Rate:Normal     Neuro/Psych negative neurological ROS  negative psych ROS   GI/Hepatic negative GI ROS, Neg liver ROS,   Endo/Other  Morbid obesity  Renal/GU negative Renal ROS  negative genitourinary   Musculoskeletal   Abdominal Normal abdominal exam  (+)   Peds  Hematology  (+) anemia ,   Anesthesia Other Findings   Reproductive/Obstetrics (+) Pregnancy                             Anesthesia Physical Anesthesia Plan  ASA: II  Anesthesia Plan: Epidural   Post-op Pain Management:    Induction:   Airway Management Planned:   Additional Equipment:   Intra-op Plan:   Post-operative Plan:   Informed Consent: I have reviewed the patients History and Physical, chart, labs and discussed the procedure including the risks, benefits and alternatives for the proposed anesthesia with the patient or authorized representative who has indicated his/her understanding and acceptance.     Plan Discussed with: Anesthesiologist  Anesthesia Plan Comments:         Anesthesia Quick Evaluation  

## 2014-04-26 NOTE — H&P (Signed)
24 y.o. 1037w0d  G3P0020 comes in for post dates induction.  Otherwise has good fetal movement and no bleeding.  Past Medical History  Diagnosis Date  . Medical history non-contributory   . Anemia     Past Surgical History  Procedure Laterality Date  . Dilation and evacuation N/A 11/20/2012    Procedure: DILATATION AND EVACUATION WITH ULTRASOUND BEFORE SURGERY;  Surgeon: Freddrick MarchKendra H. Tenny Crawoss, MD;  Location: [facility];  Service: Gynecology;  Laterality: N/A;  . Wrist surgery Left     To remove a cyst  . Cystoscopy      Age 30  . Dilation and evacuation N/A 07/13/2013    Procedure: DILATATION AND EVACUATION;  Surgeon: Loney LaurenceMichelle A Emmary Culbreath, MD;  Location: [facility];  Service: Gynecology;  Laterality: N/A;    OB History  Gravida Para Term Preterm AB SAB TAB Ectopic Multiple Living  3    2 2     0    # Outcome Date GA Lbr Len/2nd Weight Sex Delivery Anes PTL Lv  3 Current           2 SAB 2014          1 SAB               History   Social History  . Marital Status: Married    Spouse Name: N/A  . Number of Children: N/A  . Years of Education: N/A   Occupational History  . Not on file.   Social History Main Topics  . Smoking status: Never Smoker   . Smokeless tobacco: Never Used  . Alcohol Use: No     Comment: occas. prior to preg.  . Drug Use: No  . Sexual Activity: Yes    Birth Control/ Protection: None   Other Topics Concern  . Not on file   Social History Narrative   Review of patient's allergies indicates no known allergies.    Prenatal Transfer Tool  Maternal Diabetes: No Genetic Screening: Normal Maternal Ultrasounds/Referrals: Normal Fetal Ultrasounds or other Referrals:  None Maternal Substance Abuse:  No Significant Maternal Medications:  None Significant Maternal Lab Results: None  Other PNC: uncomplicated.    Filed Vitals:   04/26/14 1030  BP: 125/86  Pulse: 92  Temp:   Resp: 18     Lungs/Cor:  NAD Abdomen:  soft, gravid Ex:  no cords, erythema SVE:   2/50/-2, AROM clear FHTs:  140, good STV, NST R Toco:  qocc   A/P   Post dates induction.  GBS neg  [name] Rosales A

## 2014-04-26 NOTE — Anesthesia Procedure Notes (Addendum)
Epidural Patient location during procedure: OB Start time: 04/26/2014 4:21 PM  Staffing Anesthesiologist: Mal AmabileFOSTER, Shahir Karen Performed by: anesthesiologist   Preanesthetic Checklist Completed: patient identified, site marked, surgical consent, pre-op evaluation, timeout performed, IV checked, risks and benefits discussed and monitors and equipment checked  Epidural Patient position: sitting Prep: site prepped and draped and DuraPrep Patient monitoring: continuous pulse ox and blood pressure Approach: midline Location: L4-L5 Injection technique: LOR air  Needle:  Needle type: Tuohy  Needle gauge: 17 G Needle length: 9 cm and 9 Needle insertion depth: 7 cm Catheter type: closed end flexible Catheter size: 19 Gauge Catheter at skin depth: 12 cm Test dose: negative and Other  Assessment Events: blood not aspirated, injection not painful, no injection resistance, negative IV test and no paresthesia  Additional Notes Patient identified. Risks and benefits discussed including failed block, incomplete  Pain control, post dural puncture headache, nerve damage, paralysis, blood pressure Changes, nausea, vomiting, reactions to medications-both toxic and allergic and post Partum back pain. All questions were answered. Patient expressed understanding and wished to proceed. Sterile technique was used throughout procedure. Epidural site was Dressed with sterile barrier dressing. No paresthesias, signs of intravascular injection Or signs of intrathecal spread were encountered.  Patient was more comfortable after the epidural was dosed. Please see RN's note for documentation of vital signs and FHR which are stable. Reason for block:procedure for pain

## 2014-04-27 ENCOUNTER — Encounter (HOSPITAL_COMMUNITY): Payer: Self-pay

## 2014-04-27 ENCOUNTER — Encounter (HOSPITAL_COMMUNITY)
Admission: AD | Disposition: A | Discharge status: Home / Self Care | Payer: Self-pay | Source: Ambulatory Visit | Attending: Obstetrics and Gynecology

## 2014-04-27 DIAGNOSIS — Z9889 Other specified postprocedural states: Secondary | ICD-10-CM

## 2014-04-27 LAB — RPR: RPR Ser Ql: NONREACTIVE

## 2014-04-27 SURGERY — Surgical Case
Anesthesia: Epidural

## 2014-04-27 MED ORDER — OXYTOCIN 10 UNIT/ML IJ SOLN
40.0000 [IU] | INTRAVENOUS | Status: DC | PRN
  Administered 2014-04-27: 40 [IU] via INTRAVENOUS

## 2014-04-27 MED ORDER — NALBUPHINE HCL 10 MG/ML IJ SOLN
5.0000 mg | Freq: Once | INTRAMUSCULAR | Status: AC | PRN
  Filled 2014-04-27: qty 1

## 2014-04-27 MED ORDER — NALBUPHINE HCL 10 MG/ML IJ SOLN: 5.0000 mg | Freq: Once | INTRAMUSCULAR | Status: AC | PRN

## 2014-04-27 MED ORDER — SCOPOLAMINE 1 MG/3DAYS TD PT72
MEDICATED_PATCH | TRANSDERMAL | Status: DC
Start: 2014-04-27 — End: 2014-04-29
  Administered 2014-04-27: 1.5 mg via TRANSDERMAL
  Filled 2014-04-27: qty 1

## 2014-04-27 MED ORDER — MEASLES, MUMPS & RUBELLA VAC ~~LOC~~ INJ
0.5000 mL | INJECTION | Freq: Once | SUBCUTANEOUS | Status: DC
  Filled 2014-04-27: qty 0.5

## 2014-04-27 MED ORDER — ZOLPIDEM TARTRATE 5 MG PO TABS: 5.0000 mg | ORAL_TABLET | Freq: Every evening | ORAL | Status: DC | PRN

## 2014-04-27 MED ORDER — SODIUM CHLORIDE 0.9 % IJ SOLN: 3.0000 mL | INTRAMUSCULAR | Status: DC | PRN

## 2014-04-27 MED ORDER — KETOROLAC TROMETHAMINE 30 MG/ML IJ SOLN: 30.0000 mg | Freq: Four times a day (QID) | INTRAMUSCULAR | Status: AC | PRN

## 2014-04-27 MED ORDER — ONDANSETRON HCL 4 MG/2ML IJ SOLN
INTRAMUSCULAR | Status: DC | PRN
  Administered 2014-04-27: 4 mg via INTRAVENOUS

## 2014-04-27 MED ORDER — BUPIVACAINE HCL (PF) 0.5 % IJ SOLN
INTRAMUSCULAR | Status: DC | PRN
  Administered 2014-04-27 (×2): 5 mL

## 2014-04-27 MED ORDER — LORATADINE 10 MG PO TABS
10.0000 mg | ORAL_TABLET | Freq: Every day | ORAL | Status: DC
  Administered 2014-04-27 – 2014-04-29 (×2): 10 mg via ORAL
  Filled 2014-04-27 (×3): qty 1

## 2014-04-27 MED ORDER — OXYCODONE-ACETAMINOPHEN 5-325 MG PO TABS: 2.0000 | ORAL_TABLET | ORAL | Status: DC | PRN

## 2014-04-27 MED ORDER — MENTHOL 3 MG MT LOZG: 1.0000 | LOZENGE | OROMUCOSAL | Status: DC | PRN

## 2014-04-27 MED ORDER — ACETAMINOPHEN 500 MG PO TABS
1000.0000 mg | ORAL_TABLET | Freq: Four times a day (QID) | ORAL | Status: AC
  Administered 2014-04-27: 1000 mg via ORAL
  Filled 2014-04-27: qty 2

## 2014-04-27 MED ORDER — CEFAZOLIN SODIUM-DEXTROSE 2-3 GM-% IV SOLR
INTRAVENOUS | Status: DC | PRN
  Administered 2014-04-27: 2 g via INTRAVENOUS

## 2014-04-27 MED ORDER — LANOLIN HYDROUS EX OINT: 1.0000 "application " | TOPICAL_OINTMENT | CUTANEOUS | Status: DC | PRN

## 2014-04-27 MED ORDER — SCOPOLAMINE 1 MG/3DAYS TD PT72
1.0000 | MEDICATED_PATCH | Freq: Once | TRANSDERMAL | Status: DC
  Administered 2014-04-27: 1.5 mg via TRANSDERMAL

## 2014-04-27 MED ORDER — OXYTOCIN 40 UNITS IN LACTATED RINGERS INFUSION - SIMPLE MED: 62.5000 mL/h | INTRAVENOUS | Status: AC

## 2014-04-27 MED ORDER — FENTANYL CITRATE 0.05 MG/ML IJ SOLN
25.0000 ug | INTRAMUSCULAR | Status: DC | PRN
  Administered 2014-04-27 (×3): 50 ug via INTRAVENOUS

## 2014-04-27 MED ORDER — LACTATED RINGERS IV SOLN
INTRAVENOUS | Status: DC | PRN
  Administered 2014-04-27 (×2): via INTRAVENOUS

## 2014-04-27 MED ORDER — 0.9 % SODIUM CHLORIDE (POUR BTL) OPTIME
TOPICAL | Status: DC | PRN
  Administered 2014-04-27: 1000 mL

## 2014-04-27 MED ORDER — DIPHENHYDRAMINE HCL 25 MG PO CAPS
25.0000 mg | ORAL_CAPSULE | Freq: Four times a day (QID) | ORAL | Status: DC | PRN
  Administered 2014-04-28: 25 mg via ORAL
  Filled 2014-04-27: qty 1

## 2014-04-27 MED ORDER — LACTATED RINGERS IV SOLN
INTRAVENOUS | Status: DC
  Administered 2014-04-27: 11:00:00 via INTRAVENOUS

## 2014-04-27 MED ORDER — DIPHENHYDRAMINE HCL 25 MG PO CAPS: 25.0000 mg | ORAL_CAPSULE | ORAL | Status: DC | PRN

## 2014-04-27 MED ORDER — MEPERIDINE HCL 25 MG/ML IJ SOLN: 6.2500 mg | INTRAMUSCULAR | Status: DC | PRN

## 2014-04-27 MED ORDER — IBUPROFEN 600 MG PO TABS
600.0000 mg | ORAL_TABLET | Freq: Four times a day (QID) | ORAL | Status: DC
  Administered 2014-04-27 – 2014-04-29 (×10): 600 mg via ORAL
  Filled 2014-04-27 (×10): qty 1

## 2014-04-27 MED ORDER — FERROUS SULFATE 325 (65 FE) MG PO TABS
325.0000 mg | ORAL_TABLET | Freq: Two times a day (BID) | ORAL | Status: DC
  Administered 2014-04-27 – 2014-04-29 (×5): 325 mg via ORAL
  Filled 2014-04-27 (×5): qty 1

## 2014-04-27 MED ORDER — PROMETHAZINE HCL 25 MG/ML IJ SOLN: 6.2500 mg | INTRAMUSCULAR | Status: DC | PRN

## 2014-04-27 MED ORDER — OXYTOCIN 10 UNIT/ML IJ SOLN
INTRAMUSCULAR | Status: AC
  Filled 2014-04-27: qty 4

## 2014-04-27 MED ORDER — DIBUCAINE 1 % RE OINT: 1.0000 "application " | TOPICAL_OINTMENT | RECTAL | Status: DC | PRN

## 2014-04-27 MED ORDER — NALBUPHINE HCL 10 MG/ML IJ SOLN
5.0000 mg | INTRAMUSCULAR | Status: DC | PRN
  Administered 2014-04-27 (×2): 5 mg via INTRAVENOUS
  Filled 2014-04-27: qty 1

## 2014-04-27 MED ORDER — METHYLERGONOVINE MALEATE 0.2 MG PO TABS: 0.2000 mg | ORAL_TABLET | ORAL | Status: DC | PRN

## 2014-04-27 MED ORDER — LIDOCAINE-EPINEPHRINE (PF) 2 %-1:200000 IJ SOLN
INTRAMUSCULAR | Status: AC
  Filled 2014-04-27: qty 20

## 2014-04-27 MED ORDER — FENTANYL CITRATE 0.05 MG/ML IJ SOLN
INTRAMUSCULAR | Status: DC | PRN
  Administered 2014-04-27: 100 ug via INTRAVENOUS

## 2014-04-27 MED ORDER — MORPHINE SULFATE 0.5 MG/ML IJ SOLN
INTRAMUSCULAR | Status: AC
  Filled 2014-04-27: qty 10

## 2014-04-27 MED ORDER — FENTANYL CITRATE 0.05 MG/ML IJ SOLN
INTRAMUSCULAR | Status: AC
  Filled 2014-04-27: qty 2

## 2014-04-27 MED ORDER — NALOXONE HCL 1 MG/ML IJ SOLN
1.0000 ug/kg/h | INTRAVENOUS | Status: DC | PRN
  Filled 2014-04-27: qty 2

## 2014-04-27 MED ORDER — SIMETHICONE 80 MG PO CHEW: 80.0000 mg | CHEWABLE_TABLET | ORAL | Status: DC | PRN

## 2014-04-27 MED ORDER — DIPHENHYDRAMINE HCL 50 MG/ML IJ SOLN: 12.5000 mg | INTRAMUSCULAR | Status: DC | PRN

## 2014-04-27 MED ORDER — MEPERIDINE HCL 25 MG/ML IJ SOLN
INTRAMUSCULAR | Status: AC
  Filled 2014-04-27: qty 1

## 2014-04-27 MED ORDER — FENTANYL CITRATE 0.05 MG/ML IJ SOLN
INTRAMUSCULAR | Status: AC
  Administered 2014-04-27: 50 ug via INTRAVENOUS
  Filled 2014-04-27: qty 2

## 2014-04-27 MED ORDER — FENTANYL CITRATE 0.05 MG/ML IJ SOLN
INTRAMUSCULAR | Status: AC
  Filled 2014-04-27: qty 5

## 2014-04-27 MED ORDER — NALBUPHINE HCL 10 MG/ML IJ SOLN: 5.0000 mg | INTRAMUSCULAR | Status: DC | PRN

## 2014-04-27 MED ORDER — OXYCODONE-ACETAMINOPHEN 5-325 MG PO TABS
1.0000 | ORAL_TABLET | ORAL | Status: DC | PRN
  Administered 2014-04-27 – 2014-04-28 (×2): 1 via ORAL
  Filled 2014-04-27 (×2): qty 1

## 2014-04-27 MED ORDER — BISACODYL 10 MG RE SUPP: 10.0000 mg | Freq: Every day | RECTAL | Status: DC | PRN

## 2014-04-27 MED ORDER — BUPIVACAINE HCL (PF) 0.25 % IJ SOLN
INTRAMUSCULAR | Status: DC | PRN
  Administered 2014-04-27: 8 mL

## 2014-04-27 MED ORDER — FLEET ENEMA 7-19 GM/118ML RE ENEM: 1.0000 | ENEMA | Freq: Every day | RECTAL | Status: DC | PRN

## 2014-04-27 MED ORDER — TETANUS-DIPHTH-ACELL PERTUSSIS 5-2.5-18.5 LF-MCG/0.5 IM SUSP: 0.5000 mL | Freq: Once | INTRAMUSCULAR | Status: DC

## 2014-04-27 MED ORDER — NALOXONE HCL 0.4 MG/ML IJ SOLN: 0.4000 mg | INTRAMUSCULAR | Status: DC | PRN

## 2014-04-27 MED ORDER — METHYLERGONOVINE MALEATE 0.2 MG/ML IJ SOLN
INTRAMUSCULAR | Status: AC
  Administered 2014-04-27: 0.2 mg via INTRAMUSCULAR
  Filled 2014-04-27: qty 1

## 2014-04-27 MED ORDER — WITCH HAZEL-GLYCERIN EX PADS: 1.0000 "application " | MEDICATED_PAD | CUTANEOUS | Status: DC | PRN

## 2014-04-27 MED ORDER — KETOROLAC TROMETHAMINE 30 MG/ML IJ SOLN
INTRAMUSCULAR | Status: AC
  Filled 2014-04-27: qty 1

## 2014-04-27 MED ORDER — PRENATAL MULTIVITAMIN CH
1.0000 | ORAL_TABLET | Freq: Every day | ORAL | Status: DC
  Administered 2014-04-27 – 2014-04-29 (×3): 1 via ORAL
  Filled 2014-04-27 (×3): qty 1

## 2014-04-27 MED ORDER — ONDANSETRON HCL 4 MG/2ML IJ SOLN
INTRAMUSCULAR | Status: AC
  Filled 2014-04-27: qty 2

## 2014-04-27 MED ORDER — SIMETHICONE 80 MG PO CHEW
80.0000 mg | CHEWABLE_TABLET | ORAL | Status: DC
  Administered 2014-04-27 – 2014-04-29 (×2): 80 mg via ORAL
  Filled 2014-04-27 (×2): qty 1

## 2014-04-27 MED ORDER — SENNOSIDES-DOCUSATE SODIUM 8.6-50 MG PO TABS
2.0000 | ORAL_TABLET | ORAL | Status: DC
  Administered 2014-04-27 – 2014-04-29 (×2): 2 via ORAL
  Filled 2014-04-27 (×2): qty 2

## 2014-04-27 MED ORDER — SIMETHICONE 80 MG PO CHEW
80.0000 mg | CHEWABLE_TABLET | Freq: Three times a day (TID) | ORAL | Status: DC
  Administered 2014-04-27 – 2014-04-29 (×8): 80 mg via ORAL
  Filled 2014-04-27 (×8): qty 1

## 2014-04-27 MED ORDER — MORPHINE SULFATE (PF) 0.5 MG/ML IJ SOLN
INTRAMUSCULAR | Status: DC | PRN
  Administered 2014-04-27: 4 mg via EPIDURAL
  Administered 2014-04-27: 1 mg via INTRAVENOUS

## 2014-04-27 MED ORDER — MEPERIDINE HCL 25 MG/ML IJ SOLN
INTRAMUSCULAR | Status: DC | PRN
  Administered 2014-04-27: 7 mg via INTRAVENOUS
  Administered 2014-04-27 (×3): 6 mg via INTRAVENOUS

## 2014-04-27 MED ORDER — METHYLERGONOVINE MALEATE 0.2 MG/ML IJ SOLN
0.2000 mg | INTRAMUSCULAR | Status: DC | PRN
  Administered 2014-04-27: 0.2 mg via INTRAMUSCULAR

## 2014-04-27 MED ORDER — ACETAMINOPHEN 325 MG PO TABS: 650.0000 mg | ORAL_TABLET | ORAL | Status: DC | PRN

## 2014-04-27 MED ORDER — ONDANSETRON HCL 4 MG/2ML IJ SOLN: 4.0000 mg | Freq: Three times a day (TID) | INTRAMUSCULAR | Status: DC | PRN

## 2014-04-27 MED ORDER — SODIUM BICARBONATE 8.4 % IV SOLN
INTRAVENOUS | Status: DC | PRN
  Administered 2014-04-27 (×2): 5 mL via EPIDURAL

## 2014-04-27 SURGICAL SUPPLY — 36 items
APL SKNCLS STERI-STRIP NONHPOA (GAUZE/BANDAGES/DRESSINGS) ×1
BENZOIN TINCTURE PRP APPL 2/3 (GAUZE/BANDAGES/DRESSINGS) ×3 IMPLANT
CLAMP CORD UMBIL (MISCELLANEOUS) IMPLANT
CLOSURE WOUND 1/2 X4 (GAUZE/BANDAGES/DRESSINGS) ×1
CLOTH BEACON ORANGE TIMEOUT ST (SAFETY) ×3 IMPLANT
DRAPE SHEET LG 3/4 BI-LAMINATE (DRAPES) IMPLANT
DRSG OPSITE POSTOP 4X10 (GAUZE/BANDAGES/DRESSINGS) ×3 IMPLANT
DURAPREP 26ML APPLICATOR (WOUND CARE) ×3 IMPLANT
ELECT REM PT RETURN 9FT ADLT (ELECTROSURGICAL) ×3
ELECTRODE REM PT RTRN 9FT ADLT (ELECTROSURGICAL) ×1 IMPLANT
EXTRACTOR VACUUM BELL STYLE (SUCTIONS) IMPLANT
GLOVE BIO SURGEON STRL SZ7 (GLOVE) ×3 IMPLANT
GLOVE BIOGEL PI IND STRL 7.5 (GLOVE) IMPLANT
GLOVE BIOGEL PI INDICATOR 7.5 (GLOVE) ×2
GOWN STRL REUS W/TWL LRG LVL3 (GOWN DISPOSABLE) ×6 IMPLANT
KIT ABG SYR 3ML LUER SLIP (SYRINGE) IMPLANT
NDL HYPO 25X5/8 SAFETYGLIDE (NEEDLE) IMPLANT
NEEDLE HYPO 22GX1.5 SAFETY (NEEDLE) ×2 IMPLANT
NEEDLE HYPO 25X5/8 SAFETYGLIDE (NEEDLE) IMPLANT
NS IRRIG 1000ML POUR BTL (IV SOLUTION) ×3 IMPLANT
PACK C SECTION WH (CUSTOM PROCEDURE TRAY) ×3 IMPLANT
PAD OB MATERNITY 4.3X12.25 (PERSONAL CARE ITEMS) ×3 IMPLANT
RTRCTR C-SECT PINK 25CM LRG (MISCELLANEOUS) ×3 IMPLANT
STAPLER VISISTAT 35W (STAPLE) IMPLANT
STRIP CLOSURE SKIN 1/2X4 (GAUZE/BANDAGES/DRESSINGS) ×2 IMPLANT
SUT MNCRL 0 VIOLET CTX 36 (SUTURE) ×2 IMPLANT
SUT MONOCRYL 0 CTX 36 (SUTURE) ×4
SUT PDS AB 0 CTX 60 (SUTURE) IMPLANT
SUT PLAIN 2 0 XLH (SUTURE) IMPLANT
SUT VIC AB 0 CT1 27 (SUTURE) ×6
SUT VIC AB 0 CT1 27XBRD ANBCTR (SUTURE) ×2 IMPLANT
SUT VIC AB 2-0 CT1 27 (SUTURE) ×3
SUT VIC AB 2-0 CT1 TAPERPNT 27 (SUTURE) ×1 IMPLANT
SUT VIC AB 4-0 KS 27 (SUTURE) ×3 IMPLANT
TOWEL OR 17X24 6PK STRL BLUE (TOWEL DISPOSABLE) ×3 IMPLANT
TRAY FOLEY CATH SILVER 14FR (SET/KITS/TRAYS/PACK) ×3 IMPLANT

## 2014-04-27 NOTE — Anesthesia Postprocedure Evaluation (Signed)
  Anesthesia Post-op Note  Patient: [name] Rosales  Procedure(s) Performed: Procedure(s): CESAREAN SECTION (N/A)  Patient Location: PACU  Anesthesia Type:Epidural  Level of Consciousness: awake, alert  and oriented  Airway and Oxygen Therapy: Patient Spontanous Breathing  Post-op Pain: mild  Post-op Assessment: Post-op Vital signs reviewed and Patient's Cardiovascular Status Stable  Post-op Vital Signs: Reviewed and stable  Last Vitals:  Filed Vitals:   04/27/14 0252  BP: 116/81  Pulse: 108  Temp:   Resp:     Complications: No apparent anesthesia complications

## 2014-04-27 NOTE — Anesthesia Postprocedure Evaluation (Signed)
  Anesthesia Post-op Note  Patient: [name] Rosales  Procedure(s) Performed: Procedure(s): CESAREAN SECTION (N/A)  Patient Location: Mother Baby  Anesthesia Type:Epidural  Level of Consciousness: awake and alert   Airway and Oxygen Therapy: Patient Spontanous Breathing  Post-op Pain: mild  Post-op Assessment: Post-op Vital signs reviewed, Patient's Cardiovascular Status Stable, RESPIRATORY FUNCTION UNSTABLE, No signs of Nausea or vomiting, Pain level controlled, No headache, No residual numbness and No residual motor weakness  Post-op Vital Signs: Reviewed  Last Vitals:  Filed Vitals:   04/27/14 0656  BP: 125/68  Pulse: 96  Temp: 36.8 C  Resp: 18    Complications: No apparent anesthesia complications

## 2014-04-27 NOTE — Progress Notes (Addendum)
  Patient is eating, ambulating, not voiding yet- cath still in.  Pain control is good.  Filed Vitals:   04/27/14 0603 04/27/14 0656 04/27/14 0830 04/27/14 0930  BP: 114/78 125/68 126/66 118/55  Pulse: 90 96 74 68  Temp: 99.7 F (37.6 C) 98.3 F (36.8 C) 98.6 F (37 C) 98 F (36.7 C)  TempSrc: Oral  Oral Oral  Resp: 16 18 17 18   Height:      Weight:      SpO2: 95% 96% 96% 95%    lungs:   clear to auscultation cor:    RRR Abdomen:  soft, appropriate tenderness, incisions intact and without erythema or exudate ex:    no cords   Lab Results  Component Value Date   WBC 9.1 04/26/2014   HGB 10.2* 04/26/2014   HCT 31.7* 04/26/2014   MCV 78.3 04/26/2014   PLT 157 04/26/2014    --/--/A POS (04/08 1000)/RI  A/P    Post operative day 0.  Routine post op and postpartum care.  Expect d/c on Monday.  Percocet for pain control. Parent desire circ in office.

## 2014-04-27 NOTE — Progress Notes (Signed)
Pt pushed for over an hour with epidural with only slight descent.  After I checked pt and pushed with her for a few contractions and attempted a manual rotation of the baby's head to OA,  she developed severe back pain and could not push any longer.  Anesthesia adjusted epidural but pain although better is still too bad to continue.  Pt requests LTCS for AOD.  All risks, benefits and alternatives d/w her and she wishes to proceed.

## 2014-04-27 NOTE — Transfer of Care (Signed)
Immediate Anesthesia Transfer of Care Note  Patient: [name] Rosales  Procedure(s) Performed: Procedure(s): CESAREAN SECTION (N/A)  Patient Location: PACU  Anesthesia Type:Regional  Level of Consciousness: awake, alert , oriented and patient cooperative  Airway & Oxygen Therapy: Patient Spontanous Breathing  Post-op Assessment: Report given to RN and Post -op Vital signs reviewed and stable  Post vital signs: Reviewed and stable  Last Vitals:  Filed Vitals:   04/27/14 0252  BP: 116/81  Pulse: 108  Temp:   Resp:     Complications: No apparent anesthesia complications

## 2014-04-27 NOTE — Brief Op Note (Signed)
04/26/2014 - 04/27/2014  3:52 AM  PATIENT:  [name] Rosales  24 y.o. female  PRE-OPERATIVE DIAGNOSIS:  Primary cesarean section for arrest of descent  POST-OPERATIVE DIAGNOSIS:  Primary cesarean section for arrest of descent  PROCEDURE:  Procedure(s): CESAREAN SECTION (N/A)  SURGEON:  Surgeon(s) and Role:    * Talar Fraley, MD - Primary  ANESTHESIA:   epidural  EBL:  Total I/O In: 1800 [I.V.:1800] Out: 1650 [Urine:650; Blood:1000]  LOCAL MEDICATIONS USED:  BUPIVICAINE   SPECIMEN:  No Specimen  DISPOSITION OF SPECIMEN:  N/A  COUNTS:  YES  TOURNIQUET:  * No tourniquets in log *  DICTATION: .Note written in EPIC  PLAN OF CARE: Admit to inpatient   PATIENT DISPOSITION:  PACU - hemodynamically stable.   Delay start of Pharmacological VTE agent (>24hrs) due to surgical blood loss or risk of bleeding: not applicable  Complications:  none Medications:  Ancef, Pitocin Findings:  Baby female, Apgars 9,10, weight 9#13; nuchal cord.   Normal tubes, ovaries and uterus seen.  Baby was skin to skin with mother after birth in the OR.  Technique:  After adequate epidural anesthesia was achieved, the patient was prepped and draped in usual sterile fashion.  A foley catheter was used to drain the bladder.  A pfannanstiel incision was made with the scalpel and carried down to the fascia with the bovie cautery. The fascia was incised in the midline with the scalpel and carried in a transverse curvilinear manner bilaterally.  The fascia was reflected superiorly and inferiorly off the rectus muscles and the muscles split in the midline.  A bowel free portion of the peritoneum was entered bluntly and then extended in a superior and inferior manner with good visualization of the bowel and bladder.  The Alexis instrument was then placed and the vesico-uterine fascia tented up and incised in a transverse curvilinear manner.  A 2 cm transverse incision was made in the upper portion of the lower  uterine segment until the amnion was exposed.   The incision was extended transversely in a blunt manner.  Light meconium fluid was noted and the baby delivered in the vertex presentation without complication.  The baby was bulb suctioned and the cord was clamped and cut.  The baby was then handed to awaiting Neonatology.  The placenta was then delivered manually and the uterus cleared of all debris.  The uterine incision was then closed with a running lock stitch of 0 monocryl.  An imbricating layer of 0 monocryl was closed as well. Excellent hemostasis of the uterine incision was achieved and the abdomen was cleared with irrigation.  The peritoneum was closed with a running stitch of 2-0 vicryl.  This incorporated the rectus muscles as a separate layer.  The fascia was then closed with a running stitch of 0 vicryl.  The subcutaneous layer was closed with interrupted  stitches of 2-0 plain gut.  The skin was closed with 4-0 vicryl on a Keith needle and steri-strips.  The patient tolerated the procedure well and was returned to the recovery room in stable condition.  All counts were correct times three.  Colbin Jovel A   

## 2014-04-27 NOTE — Lactation Note (Signed)
This note was copied from the chart of [name] Rosales. Lactation Consultation Note Mom unable to latch baby. Has flat nipples, large breast pendulum shaped breast. Elevated w/wash cloth. No edema noted to areolas or nipples. Fitted mom w/#20 NS.Mom taught how to apply & clean nipple shield.  Screamed, baby had wide open latch, but very strong suck. Mom placed in shells and encouraged to wear them between feedings. Mom given shells and encouraged to wear them between feedings.  Grandmother states mom doesn't have any pain tolerance and has sensitive nipples. Mom ask if she can pump and bottle fed. I stated sure.  Hand expression taught. Baby hungry expressed 20ml from Rt. Breast and 17ml on Lt. Breast. Given in bottle. Mom encouraged to feed baby 8-12 times/24 hours and with feeding cues. Mom reports + breast changes w/pregnancy. Educated about newborn behavior.  Mom shown how to use DEBP & how to disassemble, clean, & reassemble parts. Mom knows to pump q3h for 15-20 min.Encouraged comfort during BF so colostrum flows better and mom will enjoy the feeding longer. Taking deep breaths and breast massage during BF. Referred to Baby and Me Book in Breastfeeding section Pg. 22-23 for position options and Proper latch demonstration. [facility] brochure given w/resources, support groups and LC services. Has good support from family. Will be getting DEBP on d/c from insurance company.  Discussed supply and demand, engorgement, and I&O. Patient Name: [name] Ralph LeydenKerrington Sigel NFAOZ'HToday's Date: 04/27/2014 Reason for consult: Initial assessment   Maternal Data Has patient been taught Hand Expression?: Yes  Feeding Feeding Type: Breast Milk Length of feed: 0 min  LATCH Score/Interventions Latch: Grasps breast easily, tongue down, lips flanged, rhythmical sucking. Intervention(s): Adjust position;Assist with latch;Breast massage;Breast compression  Audible Swallowing: None Intervention(s): Skin to skin;Hand  expression;Alternate breast massage  Type of Nipple: Flat Intervention(s): Shells;Double electric pump  Comfort (Breast/Nipple): Filling, red/small blisters or bruises, mild/mod discomfort  Problem noted: Severe discomfort (cant tolerate latch)  Hold (Positioning): Assistance needed to correctly position infant at breast and maintain latch. Intervention(s): Breastfeeding basics reviewed;Support Pillows;Position options;Skin to skin  LATCH Score: 5  Lactation Tools Discussed/Used Tools: Shells;Nipple Shields Nipple shield size: 20 Shell Type: Inverted WIC Program: No Pump Review: Setup, frequency, and cleaning;Milk Storage Initiated by:: Peri JeffersonL. Tonjia Parillo RN Date initiated:: 04/27/14   Consult Status Consult Status: Follow-up Date: 04/27/14 Follow-up type: In-patient    Charyl DancerCARVER, Jasyn Mey G 04/27/2014, 11:43 AM

## 2014-04-27 NOTE — Op Note (Signed)
04/26/2014 - 04/27/2014  3:52 AM  PATIENT:  [name] Rosales  24 y.o. female  PRE-OPERATIVE DIAGNOSIS:  Primary cesarean section for arrest of descent  POST-OPERATIVE DIAGNOSIS:  Primary cesarean section for arrest of descent  PROCEDURE:  Procedure(s): CESAREAN SECTION (N/A)  SURGEON:  Surgeon(s) and Role:    * Carrington ClampMichelle Lebron Nauert, MD - Primary  ANESTHESIA:   epidural  EBL:  Total I/O In: 1800 [I.V.:1800] Out: 1650 [Urine:650; Blood:1000]  LOCAL MEDICATIONS USED:  BUPIVICAINE   SPECIMEN:  No Specimen  DISPOSITION OF SPECIMEN:  N/A  COUNTS:  YES  TOURNIQUET:  * No tourniquets in log *  DICTATION: .Note written in EPIC  PLAN OF CARE: Admit to inpatient   PATIENT DISPOSITION:  PACU - hemodynamically stable.   Delay start of Pharmacological VTE agent (>24hrs) due to surgical blood loss or risk of bleeding: not applicable  Complications:  none Medications:  Ancef, Pitocin Findings:  Baby female, Apgars 9,10, weight 9#13; nuchal cord.   Normal tubes, ovaries and uterus seen.  Baby was skin to skin with mother after birth in the OR.  Technique:  After adequate epidural anesthesia was achieved, the patient was prepped and draped in usual sterile fashion.  A foley catheter was used to drain the bladder.  A pfannanstiel incision was made with the scalpel and carried down to the fascia with the bovie cautery. The fascia was incised in the midline with the scalpel and carried in a transverse curvilinear manner bilaterally.  The fascia was reflected superiorly and inferiorly off the rectus muscles and the muscles split in the midline.  A bowel free portion of the peritoneum was entered bluntly and then extended in a superior and inferior manner with good visualization of the bowel and bladder.  The Alexis instrument was then placed and the vesico-uterine fascia tented up and incised in a transverse curvilinear manner.  A 2 cm transverse incision was made in the upper portion of the lower  uterine segment until the amnion was exposed.   The incision was extended transversely in a blunt manner.  Light meconium fluid was noted and the baby delivered in the vertex presentation without complication.  The baby was bulb suctioned and the cord was clamped and cut.  The baby was then handed to awaiting Neonatology.  The placenta was then delivered manually and the uterus cleared of all debris.  The uterine incision was then closed with a running lock stitch of 0 monocryl.  An imbricating layer of 0 monocryl was closed as well. Excellent hemostasis of the uterine incision was achieved and the abdomen was cleared with irrigation.  The peritoneum was closed with a running stitch of 2-0 vicryl.  This incorporated the rectus muscles as a separate layer.  The fascia was then closed with a running stitch of 0 vicryl.  The subcutaneous layer was closed with interrupted  stitches of 2-0 plain gut.  The skin was closed with 4-0 vicryl on a Keith needle and steri-strips.  The patient tolerated the procedure well and was returned to the recovery room in stable condition.  All counts were correct times three.  Maretta Overdorf A

## 2014-04-28 LAB — CBC
HEMATOCRIT: 24.2 % — AB (ref 36.0–46.0)
Hemoglobin: 7.9 g/dL — ABNORMAL LOW (ref 12.0–15.0)
MCH: 25.6 pg — AB (ref 26.0–34.0)
MCHC: 32.6 g/dL (ref 30.0–36.0)
MCV: 78.6 fL (ref 78.0–100.0)
Platelets: 150 10*3/uL (ref 150–400)
RBC: 3.08 MIL/uL — ABNORMAL LOW (ref 3.87–5.11)
RDW: 16.6 % — AB (ref 11.5–15.5)
WBC: 8.4 10*3/uL (ref 4.0–10.5)

## 2014-04-28 NOTE — Progress Notes (Addendum)
  Patient is eating, ambulating, voiding.  Pain control is good.  Filed Vitals:   04/27/14 1744 04/27/14 2200 04/28/14 0215 04/28/14 0546  BP: 125/54 116/71 108/54 119/51  Pulse: 73 80 75 80  Temp: 97.9 F (36.6 C) 98.4 F (36.9 C) 97.9 F (36.6 C) 97.9 F (36.6 C)  TempSrc: Oral Oral Oral Oral  Resp: 18 18 18 20   Height:      Weight:      SpO2: 97% 99% 94%     lungs:   clear to auscultation cor:    RRR Abdomen:  soft, appropriate tenderness, incisions intact and without erythema or exudate ex:    no cords   Lab Results  Component Value Date   WBC 8.4 04/28/2014   HGB 7.9* 04/28/2014   HCT 24.2* 04/28/2014   MCV 78.6 04/28/2014   PLT 150 04/28/2014    --/--/A POS (04/08 1000)/RI  A/P    Post operative day 1.  Routine post op and postpartum care.  Expect d/c routine. Iron.  Percocet for pain control. Parents desire circ in office.

## 2014-04-28 NOTE — Lactation Note (Signed)
This note was copied from the chart of Boy [place]. Lactation Consultation Note  Patient Name: Boy Ralph LeydenKerrington Seidel JYNWG'NToday's Date: 04/28/2014 Reason for consult: Follow-up assessment;Other (Comment) (pumping only) Mom continues pumping only because of difficult latch and sensitive nipples.  Mom planning to call her insurance tomorrow for a breast pump and will [facility].  LC reviewed pumping and breast massage guidelines, as well as milk storage guidelines in Baby and Me.  Mom's Mother and other family members present.   Maternal Data    Feeding Feeding Type: Bottle Fed - Formula Nipple Type: Slow - flow  LATCH Score/Interventions              N/A - pumping only        Lactation Tools Discussed/Used Pump Review: Milk Storage;Other (comment) (discussed pump rental options for discharge)   Consult Status Consult Status: Follow-up Date: 04/29/14 Follow-up type: In-patient    Warrick ParisianBryant, Sabryn Preslar [facility] 04/28/2014, 4:54 PM

## 2014-04-29 ENCOUNTER — Encounter (HOSPITAL_COMMUNITY): Payer: Self-pay | Admitting: Obstetrics and Gynecology

## 2014-04-29 ENCOUNTER — Ambulatory Visit: Payer: Self-pay

## 2014-04-29 NOTE — Discharge Summary (Signed)
Obstetric Discharge Summary Reason for Admission- Induction Prenatal Procedures: ultrasound Intrapartum Procedures: cesarean: low cervical, transverse Postpartum Procedures: none Complications-Operative and Postpartum: none HEMOGLOBIN  Date Value Ref Range Status  04/28/2014 7.9* 12.0 - 15.0 g/dL Final    Comment:    REPEATED TO VERIFY DELTA CHECK NOTED    HCT  Date Value Ref Range Status  04/28/2014 24.2* 36.0 - 46.0 % Final    Physical Exam:  General: alert Lochia: appropriate Uterine Fundus: firm Incision: healing well DVT Evaluation: No evidence of DVT seen on physical exam.  Discharge Diagnoses: Term Pregnancy-delivered and Arrest of decent  Discharge Information: Date: 04/29/2014 Activity: pelvic rest Diet: routine Medications: PNV and Ibuprofen Condition: stable Instructions: refer to practice specific booklet Discharge to: home Follow-up Information    Follow up with HORVATH,MICHELLE A, MD. Go in 1 month.   Specialty:  Obstetrics and Gynecology   Contact information:   [address] [place] [id number] [phone]       Newborn Data: Live born female  Birth Weight: 10 lb (4535 g) APGAR: 9, 10  Home with mother.  ANDERSON,MARK E 04/29/2014, 9:16 AM

## 2014-04-29 NOTE — Progress Notes (Signed)
POD#2 Pt without complaints. Ready for discharge.

## 2014-04-29 NOTE — Lactation Note (Signed)
This note was copied from the chart of [name] Rosales. Lactation Consultation Note; Mother plans to pump for infant and bottle feed. She states that infant has a difficult latch. She declines any more assistance with latch. Lots of teaching with mother and father. Advised to pump every 2-3 hours for 15-20 mins. Advised mother in breast massage. Reviewed engorgement treatment from baby and me book. Mother rented a 2 week Symphony. She states her insurance will provide a pump. Discussed use of fenugreek if needed. MGM at mother's side with lots of questions. Mother advised in amts of formula needed to feed infant. Mother receptive to all teaching. She is aware of available LC services and [facility].  Patient Name: [name] Rosales ZOXWR'UToday's Date: 04/29/2014     Maternal Data    Feeding Feeding Type: Formula Nipple Type: Slow - flow  LATCH Score/Interventions                      Lactation Tools Discussed/Used     Consult Status      [name] Rosales, [name] Rosales 04/29/2014, 3:18 PM

## 2014-05-26 IMAGING — US US OB TRANSVAGINAL
1 series · 14 of 18 positions shown · non-contrast
Comparison: No similar prior exam is available at this institution
for comparison or on [HOSPITAL] PACS.

CLINICAL DATA: Fetal demise, planned dilatation and extraction

EXAM:
TRANSVAGINAL OB ULTRASOUND
TECHNIQUE: Transvaginal ultrasound was performed for complete evaluation of the
gestation as well as the maternal uterus, adnexal regions, and
pelvic cul-de-sac.

[Series 1: us ob transvaginal · 18 acquisitions, 14 frames shown]
[im 1/18]
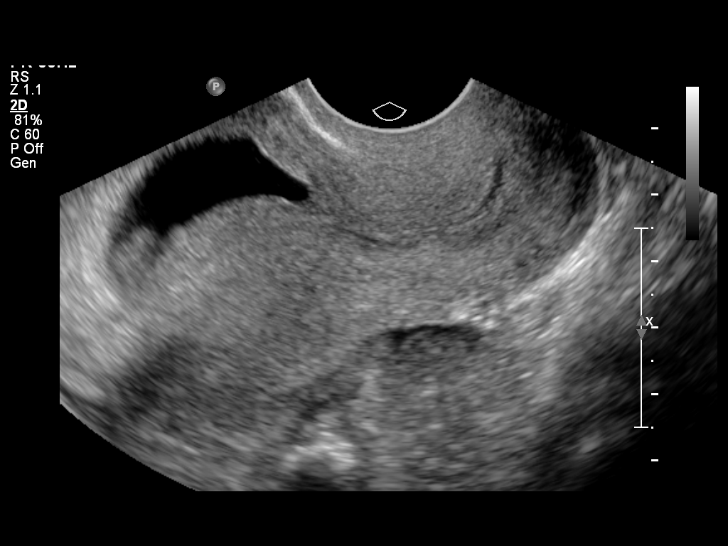
[im 2/18]
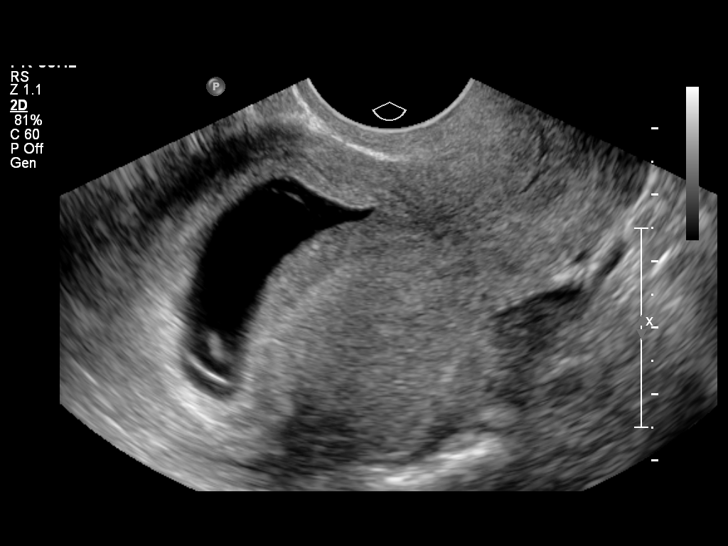
[im 4/18]
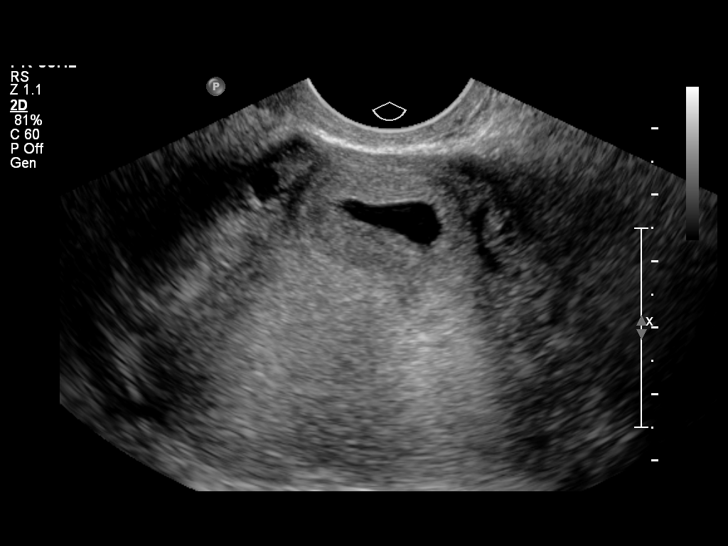
[im 5/18]
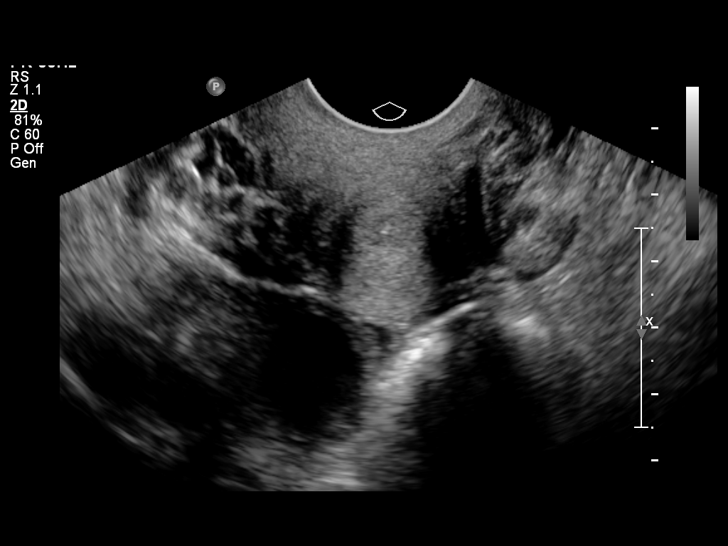
[im 6/18]
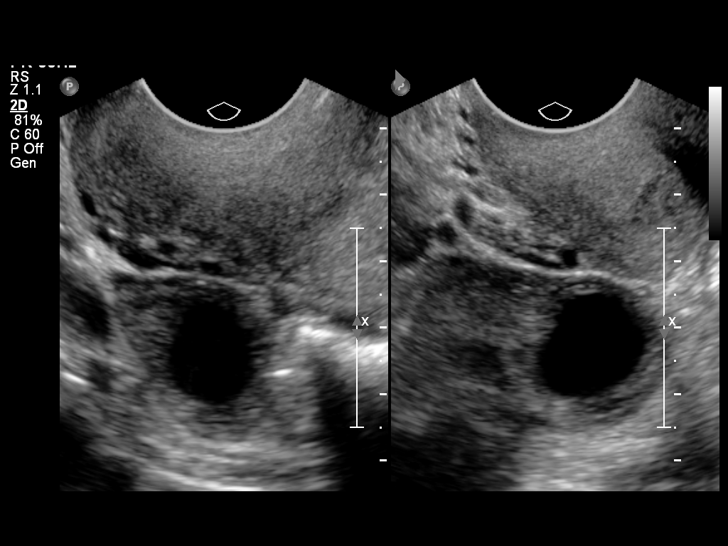
[im 8/18]
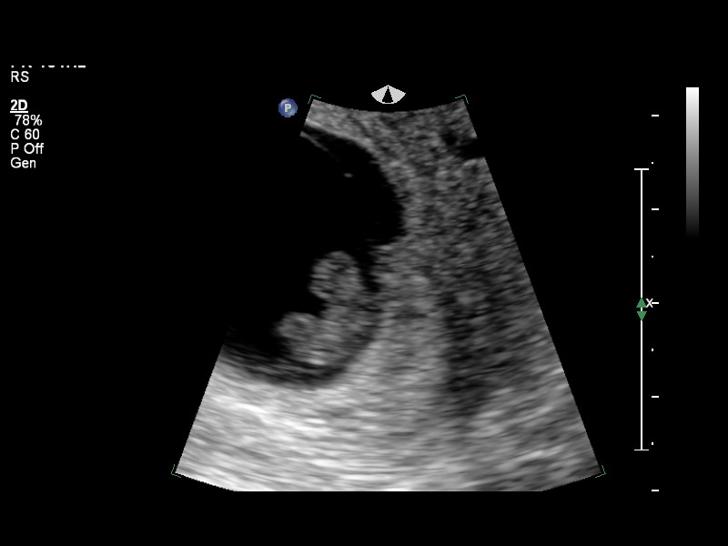
[im 9/18]
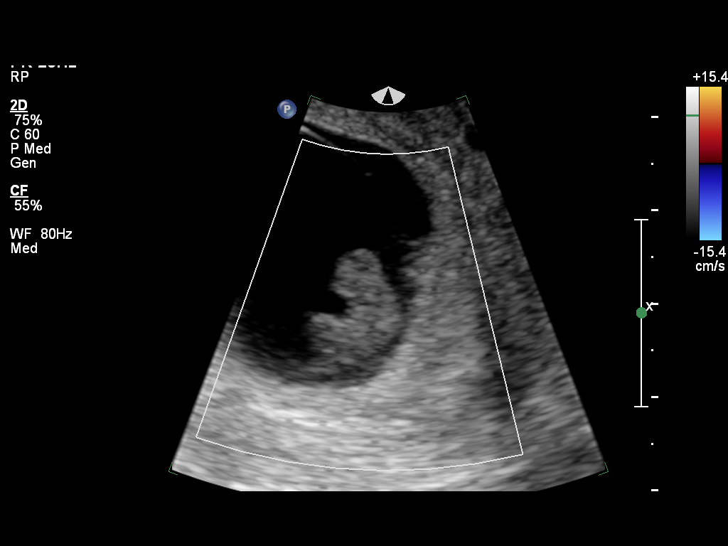
[im 10/18]
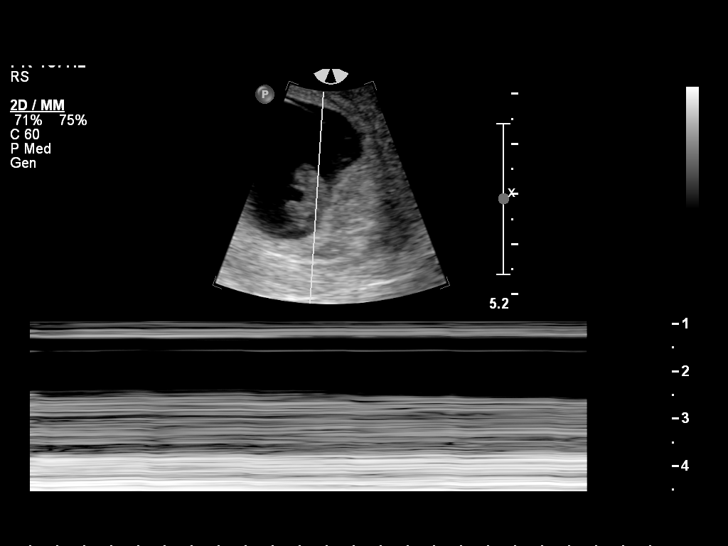
[im 11/18]
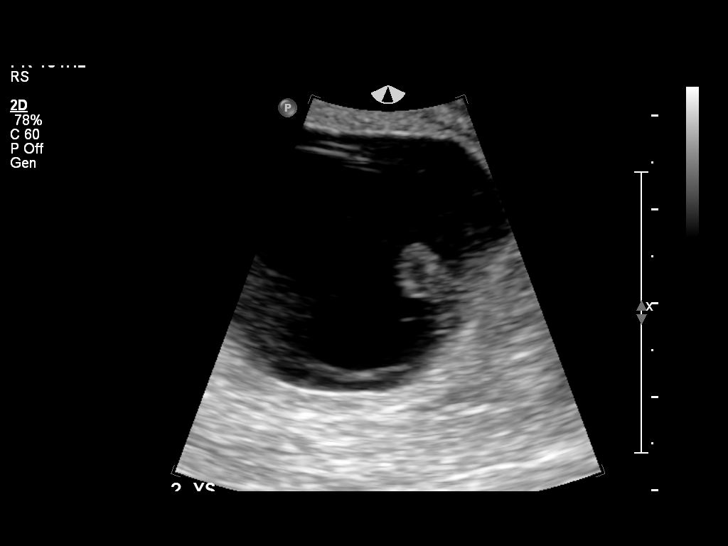
[im 13/18]
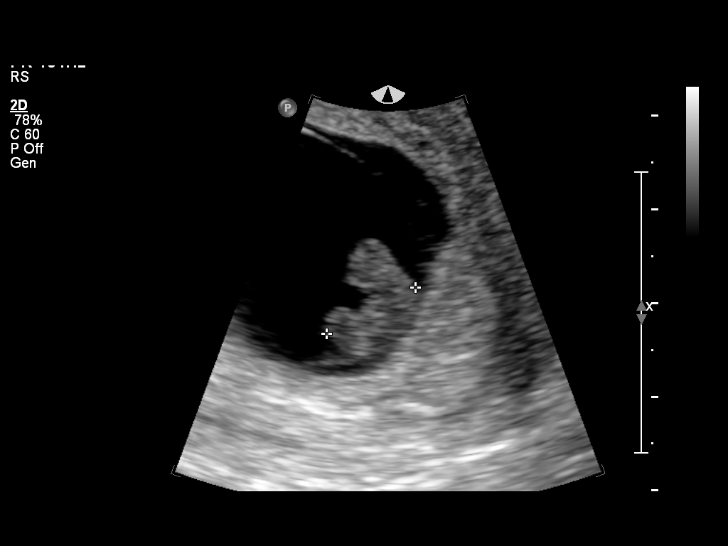
[im 14/18]
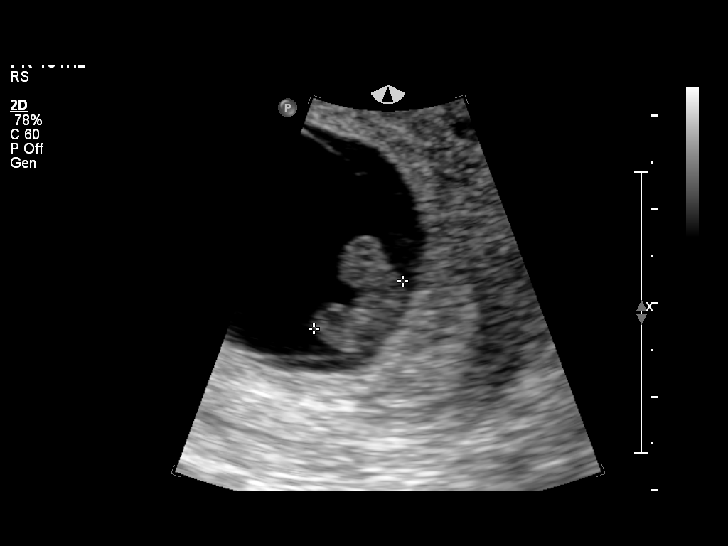
[im 15/18]
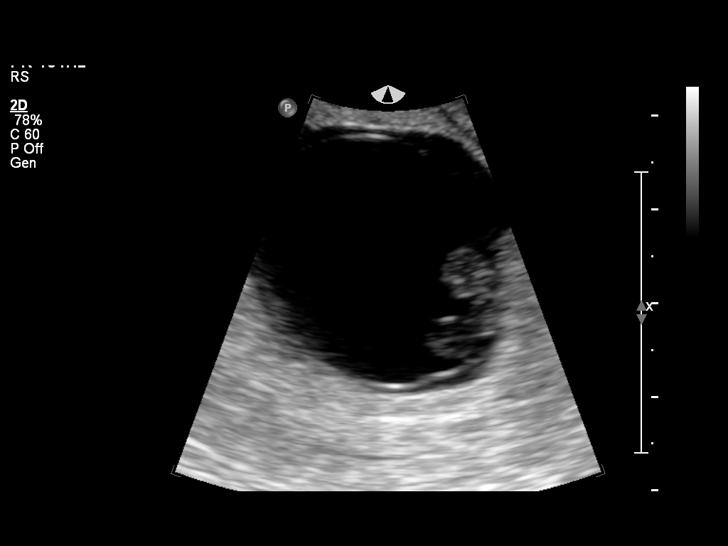
[im 17/18]
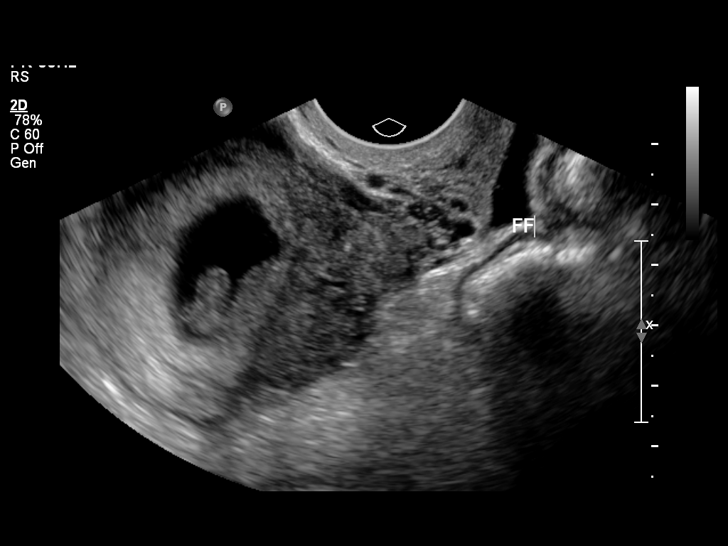
[im 18/18]
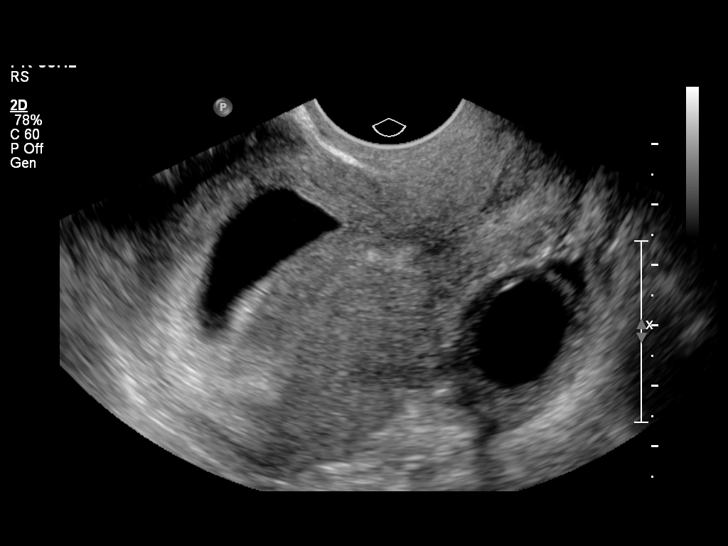

[14 of 18 positions shown; findings below may reference images not displayed]

FINDINGS: Intrauterine gestational sac: Visualized, mildly irregular

Yolk sac:  Visualized

Embryo:  Visualized

Cardiac Activity: Not visualized

Heart Rate: 0 bpm

CRL:   11  mm   7 w 2d                  US EDC: n/a

Maternal uterus/adnexae: The ovaries are normal.
IMPRESSION: Intrauterine fetal demise at 7 weeks 2 days.

## 2015-01-13 IMAGING — US US OB TRANSVAGINAL
1 series · 14 of 28 positions shown · non-contrast
Comparison: None relevant.

CLINICAL DATA: 22-year-old female with vaginal bleeding in
pregnancy. Estimated gestational age by LMP 7 weeks 3 days. Initial
encounter.

EXAM:
TRANSVAGINAL OB ULTRASOUND
TECHNIQUE: Transvaginal ultrasound was performed for complete evaluation of the
gestation as well as the maternal uterus, adnexal regions, and
pelvic cul-de-sac.

[Series 1: us ob transvaginal · 37 acquisitions, 14 frames shown]
[im 2/37]
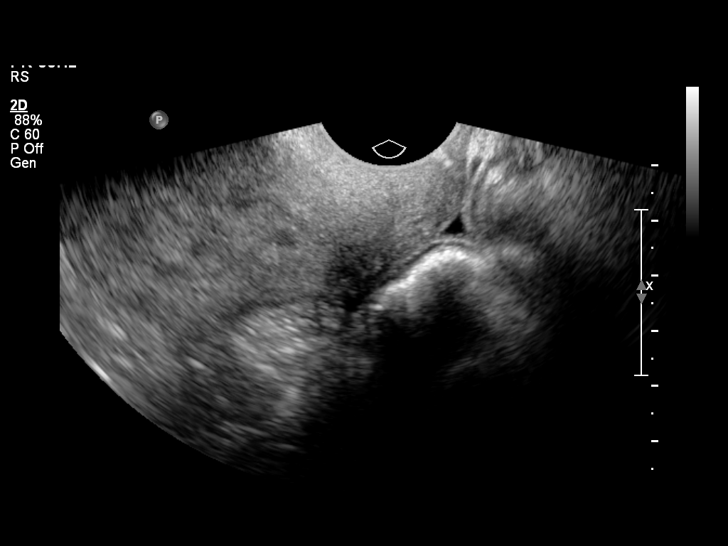
[im 5/37]
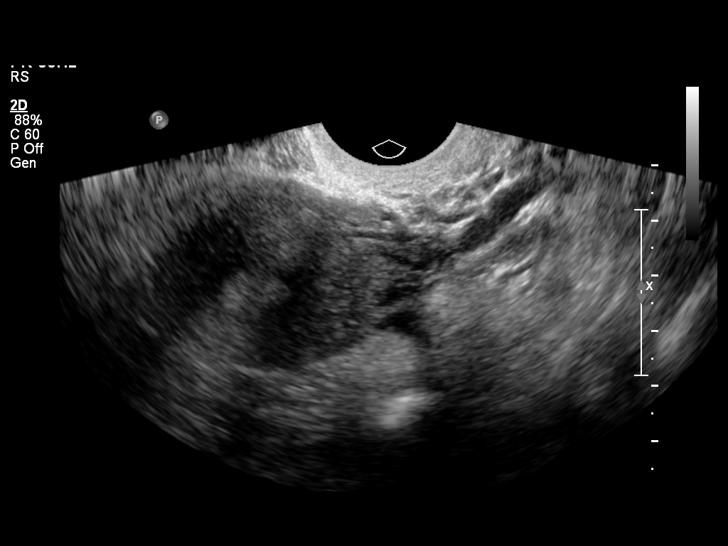
[im 7/37]
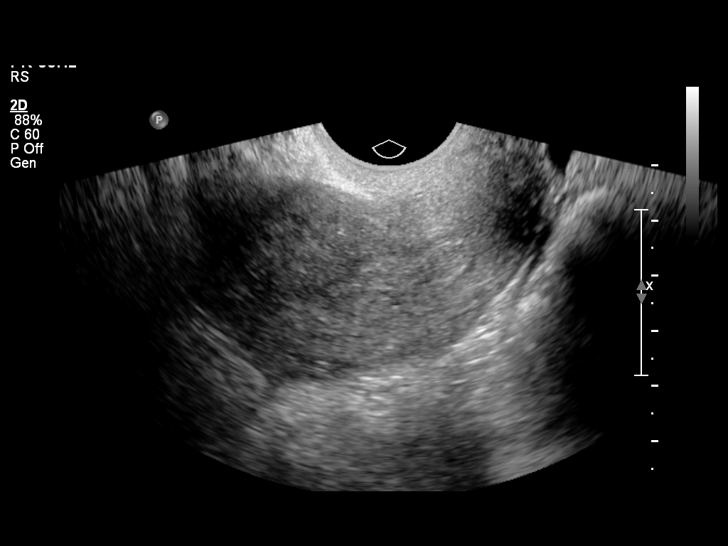
[im 10/37]
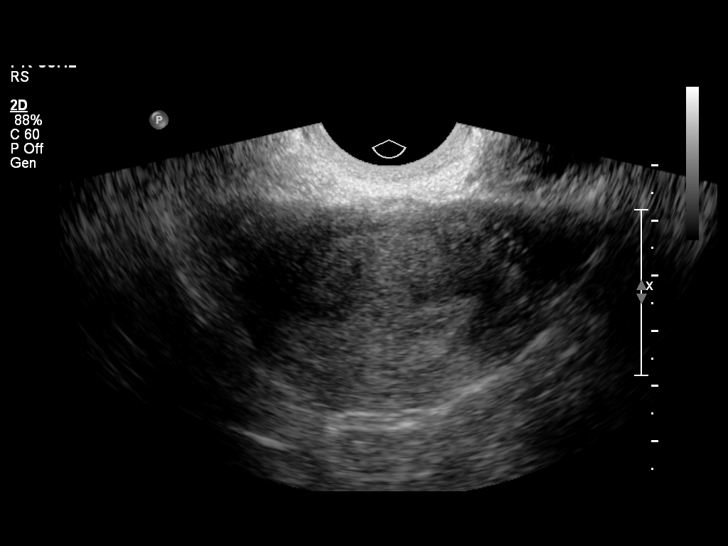
[im 13/37]
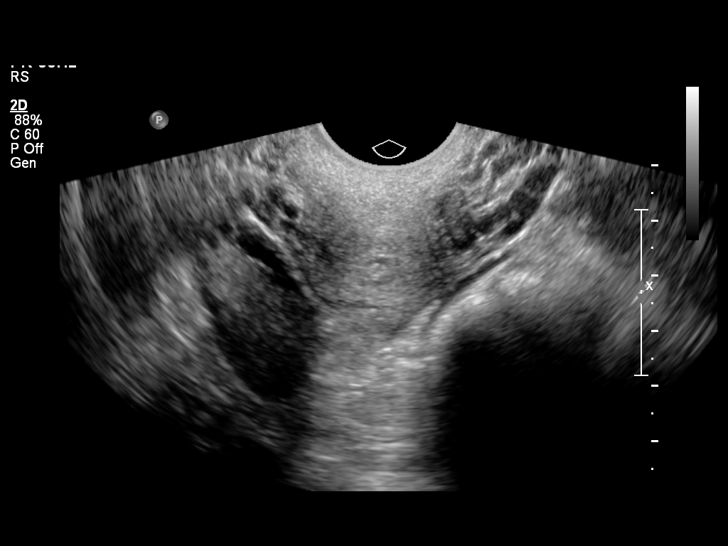
[im 15/37]
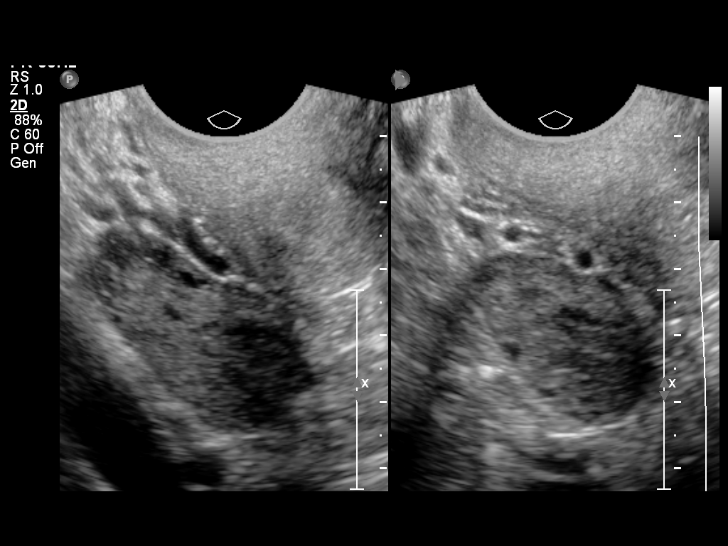
[im 18/37]
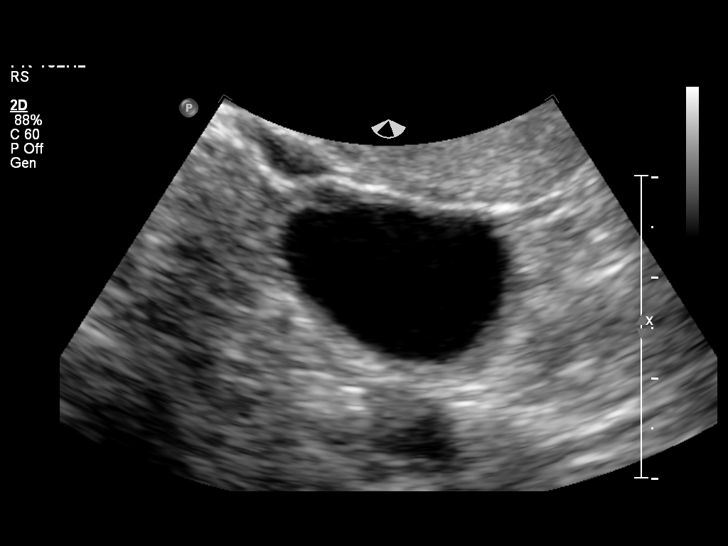
[im 21/37]
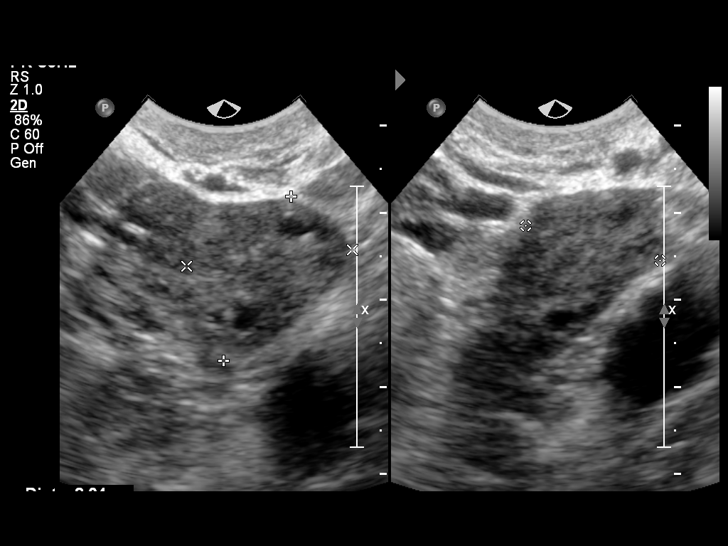
[im 23/37]
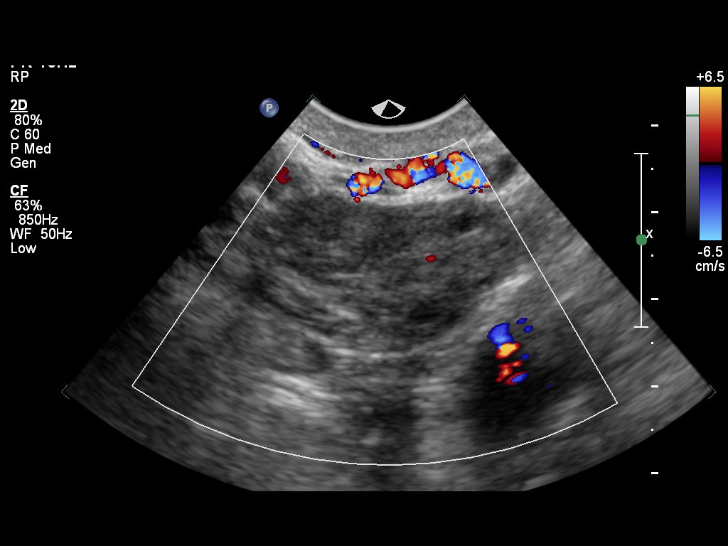
[im 26/37]
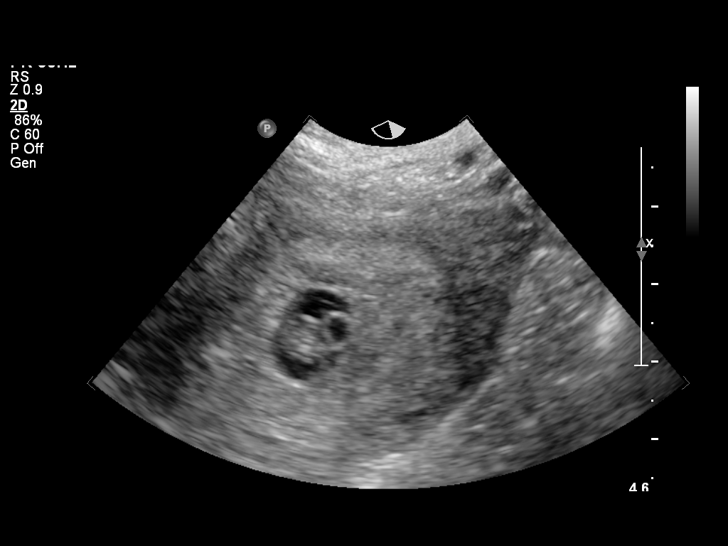
[im 29/37]
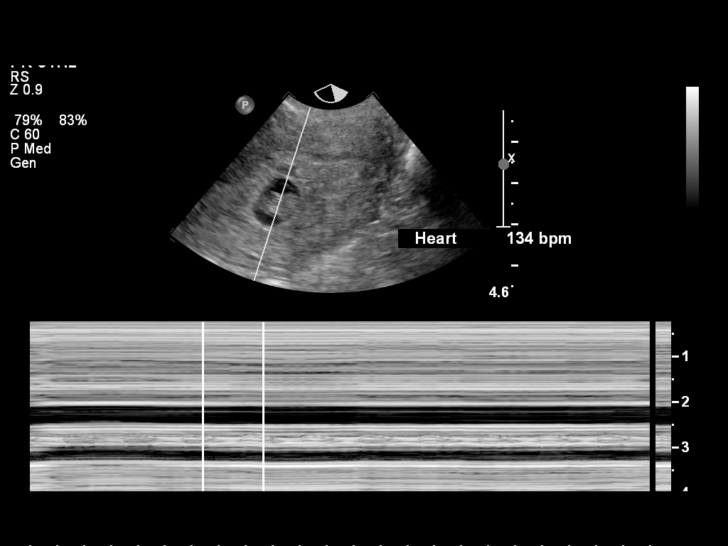
[im 31/37]
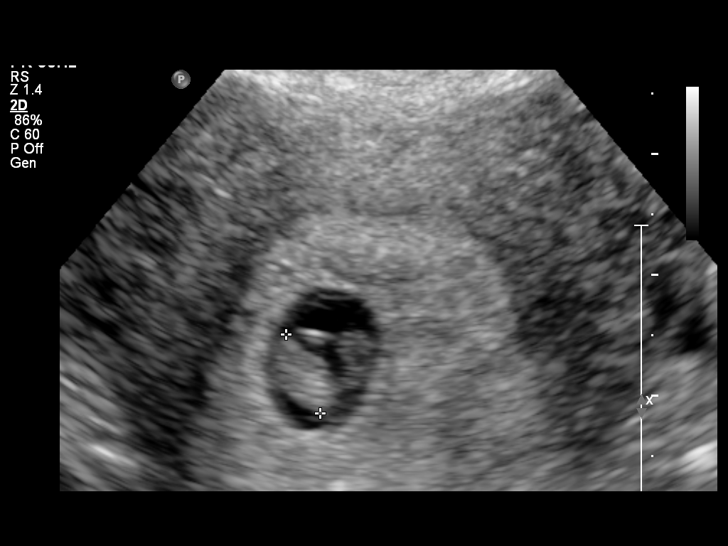
[im 34/37]
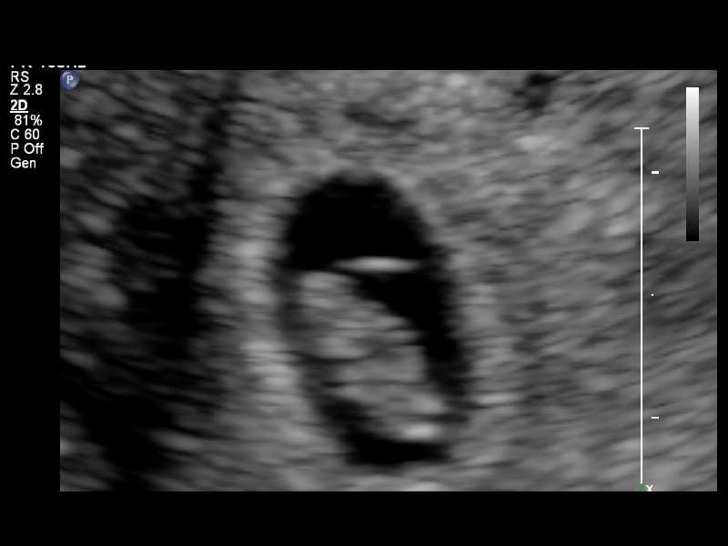
[im 37/37]
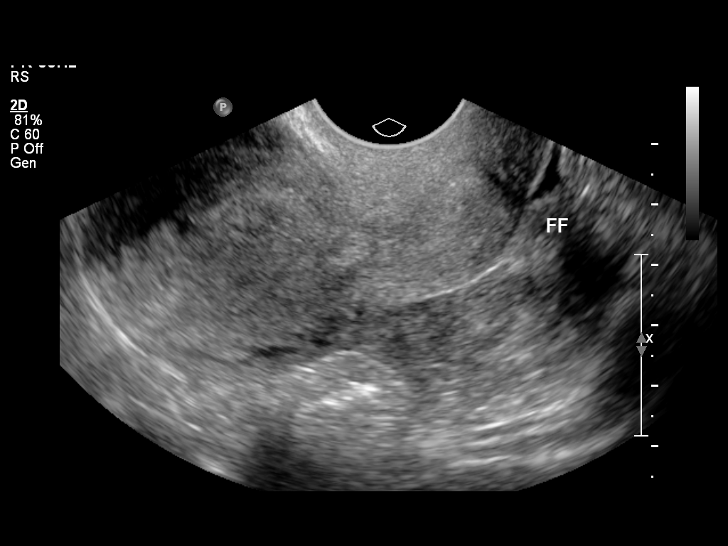

[14 of 28 positions shown; findings below may reference images not displayed]

FINDINGS: Intrauterine gestational sac: Single

Yolk sac:  Visible

Embryo:  Visible

Cardiac Activity: Detected

Heart Rate: 134 bpm

CRL:   8.1  mm   6 w 6 d                  US EDC: 03/03/2014

Maternal uterus/adnexae: No subchorionic hemorrhage. Both ovaries
are normal measuring 3.8 x 1.7 x 1.9 cm on the right and 2.0 x 1.9 x
1.6 cm on the left. Trace simple appearing free fluid (image 39).
IMPRESSION: Single living IUP demonstrated. No acute maternal findings
visualized.

## 2015-01-15 IMAGING — US US OB TRANSVAGINAL
1 series · 14 of 28 positions shown · non-contrast
Comparison: Ob ultrasound 07/10/2013

CLINICAL DATA: vaginal bleeding

EXAM:
TRANSVAGINAL OB ULTRASOUND
TECHNIQUE: Transvaginal ultrasound was performed for complete evaluation of the
gestation as well as the maternal uterus, adnexal regions, and
pelvic cul-de-sac.

[Series 1: us ob comp less 14 wks · 33 acquisitions, 14 frames shown]
[im 2/33]
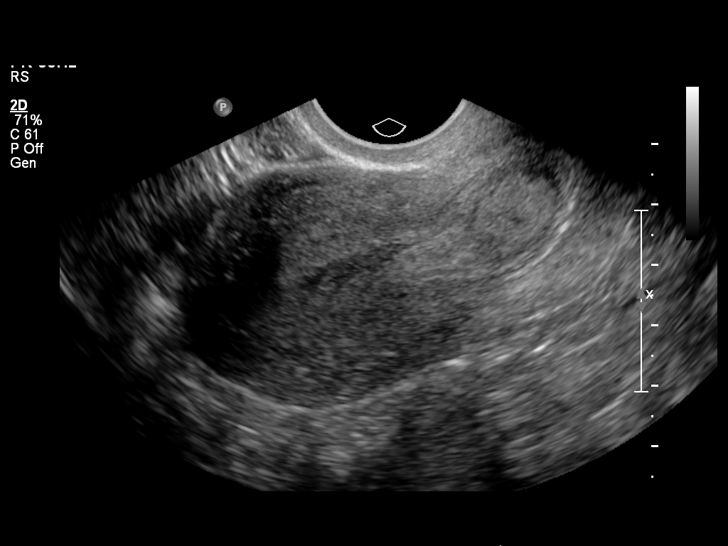
[im 4/33]
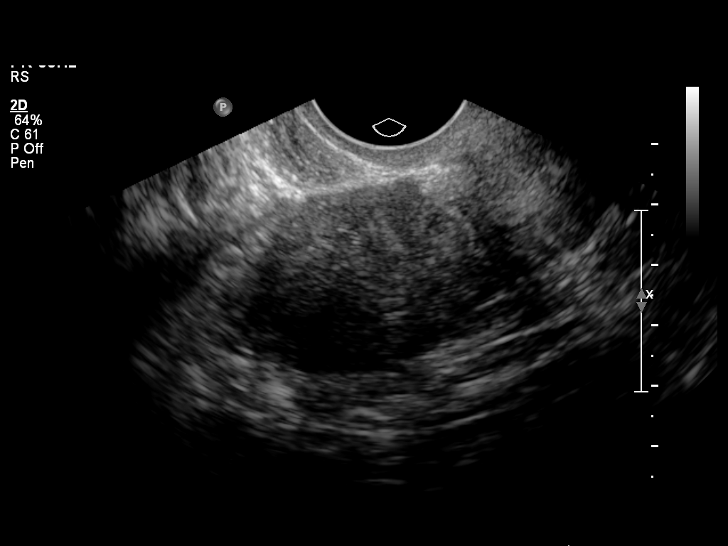
[im 6/33]
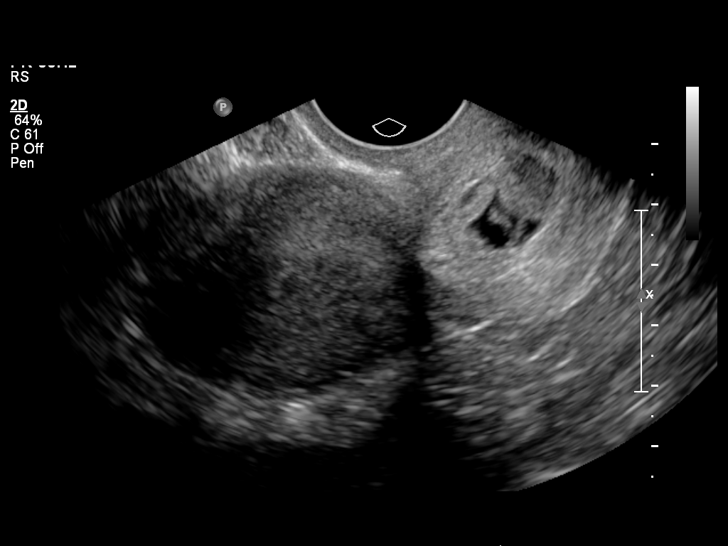
[im 9/33]
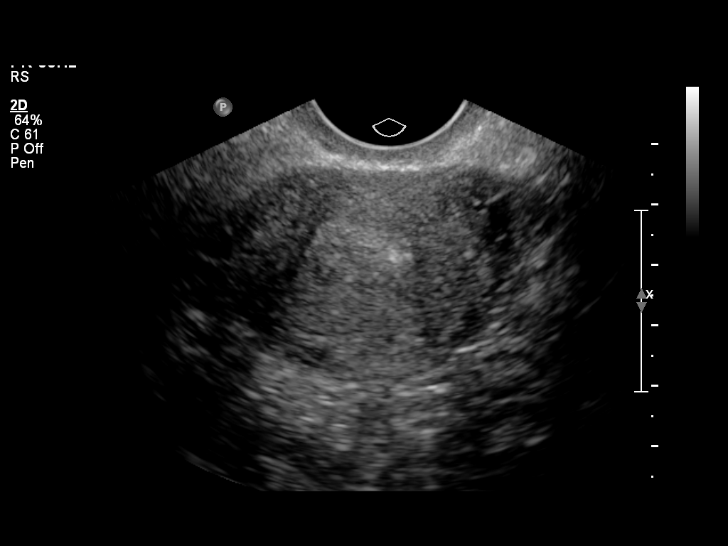
[im 11/33]
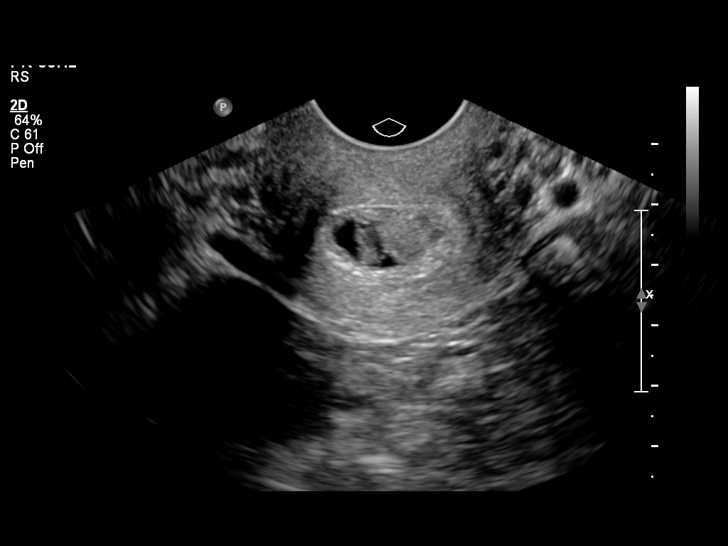
[im 14/33]
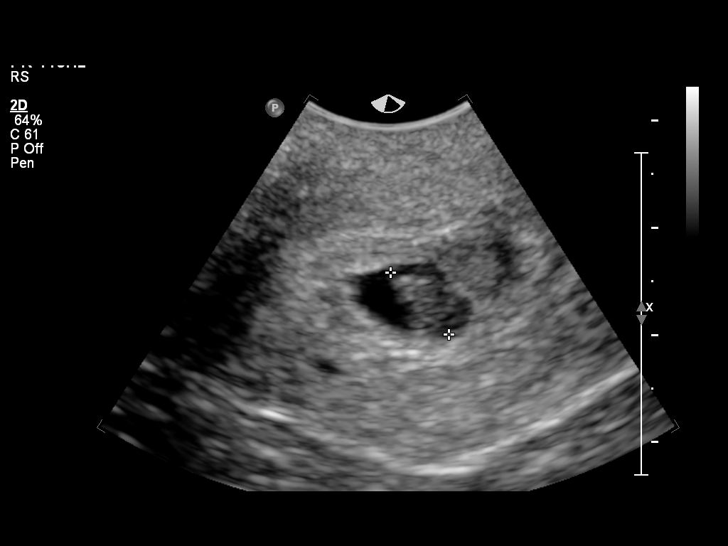
[im 16/33]
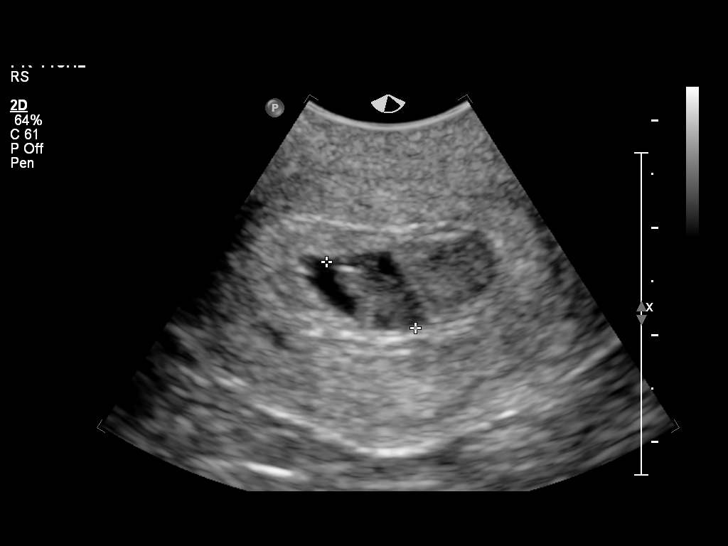
[im 18/33]
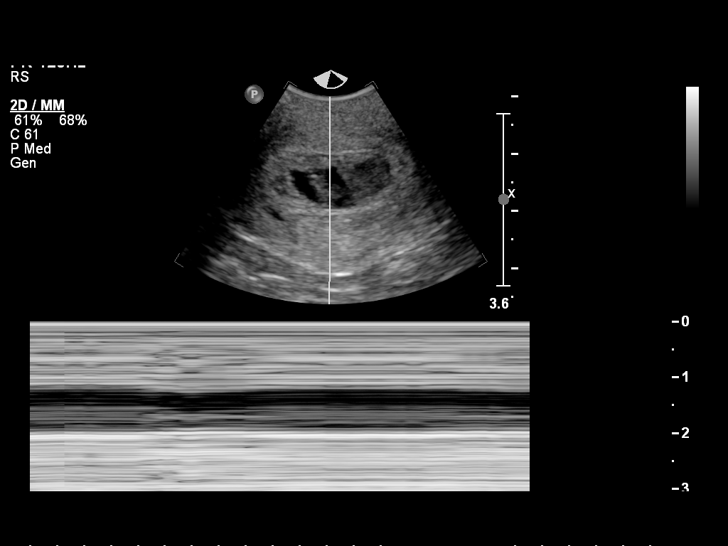
[im 21/33]
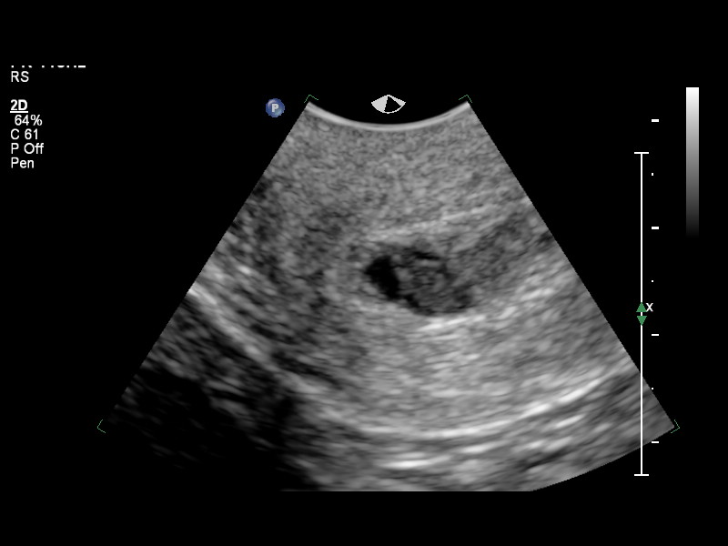
[im 23/33]
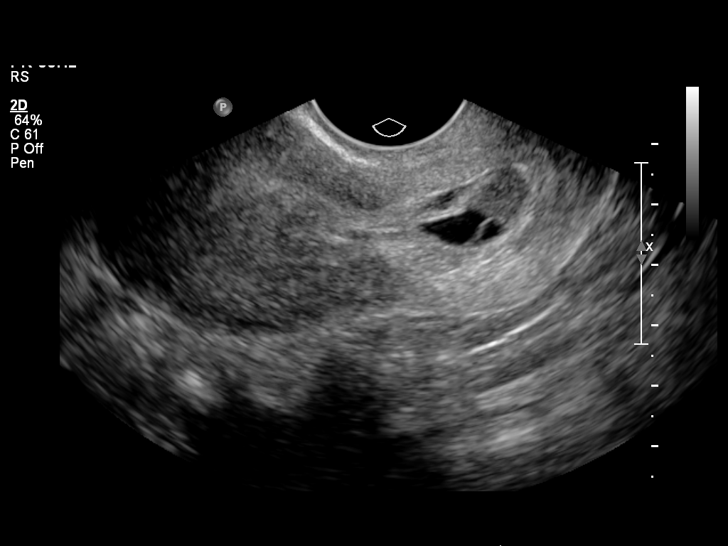
[im 25/33]
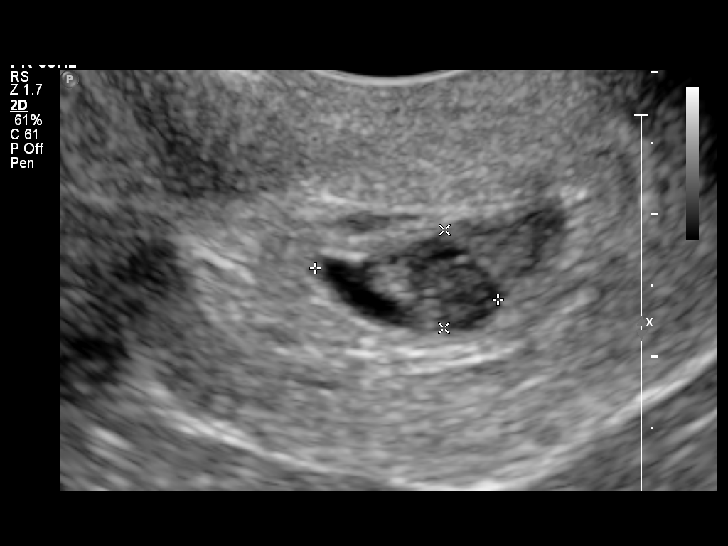
[im 28/33]
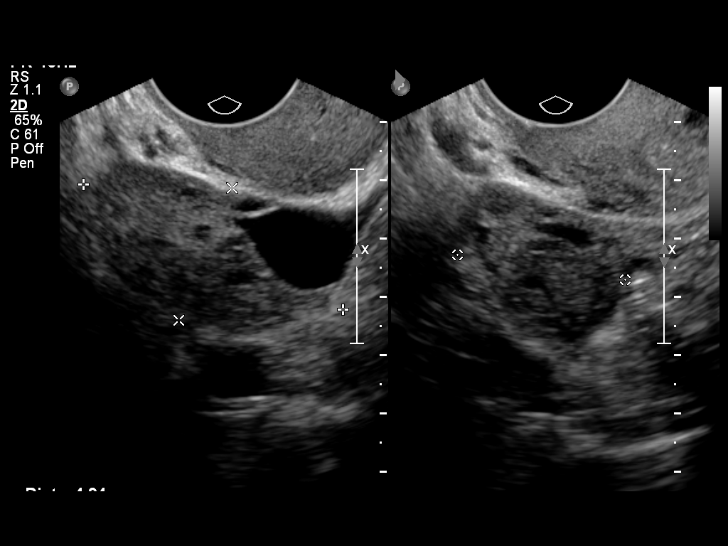
[im 30/33]
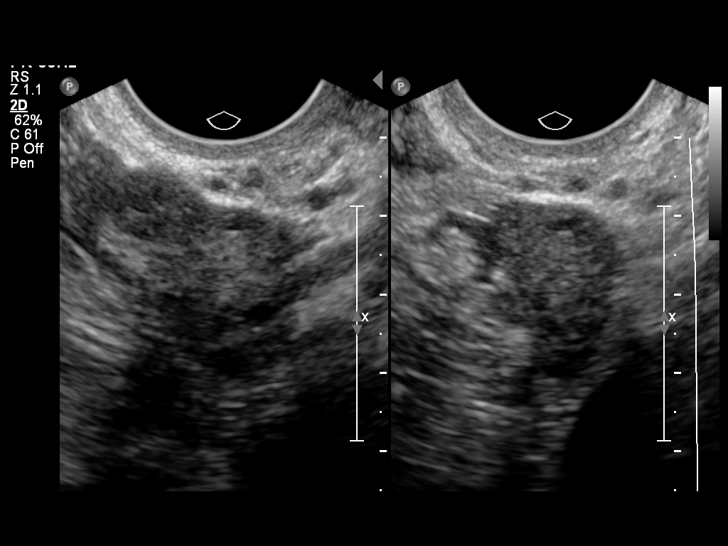
[im 33/33]
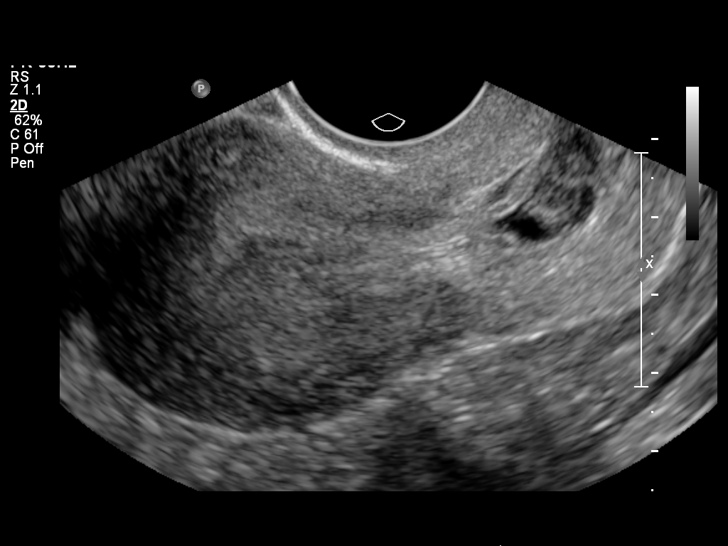

[14 of 28 positions shown; findings below may reference images not displayed]

FINDINGS: Gestational sac is visualized and demonstrates a slightly irregular
in shape. The gestational sac is visualized in the cervical canal.
There are heterogeneous echoes surrounding a sac likely representing
areas of hemorrhage.

Yolk sac:  Not visualized

Embryo:  Appreciated

Cardiac Activity: None

Heart Rate: Not applicable

MSD: 10.9  mm   5 w   6  d

CRL:   9.4  mm   7 w 0 d

A small subchorionic hemorrhage is appreciated.

Maternal uterus/adnexae: Unremarkable
IMPRESSION: Ultrasound findings consistent with an active abortion. Correlation
with serial beta HCG is recommended. And if clinically warranted
re-evaluation with ultrasound.

## 2016-03-16 ENCOUNTER — Ambulatory Visit (INDEPENDENT_AMBULATORY_CARE_PROVIDER_SITE_OTHER): Payer: 59 | Admitting: Primary Care

## 2016-03-16 ENCOUNTER — Telehealth: Payer: Self-pay | Admitting: Family Medicine

## 2016-03-16 ENCOUNTER — Encounter: Payer: Self-pay | Admitting: Primary Care

## 2016-03-16 VITALS — BP 114/70 | HR 88 | Temp 98.2°F | Ht 65.0 in | Wt 262.8 lb

## 2016-03-16 DIAGNOSIS — Z9109 Other allergy status, other than to drugs and biological substances: Secondary | ICD-10-CM | POA: Insufficient documentation

## 2016-03-16 MED ORDER — LEVOCETIRIZINE DIHYDROCHLORIDE 5 MG PO TABS: 5.0000 mg | ORAL_TABLET | Freq: Every evening | ORAL | 1 refills | Status: DC

## 2016-03-16 NOTE — Telephone Encounter (Signed)
TELEPHONE ADVICE RECORD [facility]  Patient Name: [name] Rosales  DOB: 08/10/1990    Initial Comment Caller states that she has lost hearing in her infected ear. She has an appointment at 4:15.   Nurse Assessment  Nurse: Odis LusterBowers, RN, Bjorn Loserhonda Date/Time Lamount Cohen(Eastern Time): 03/16/2016 2:13:06 PM  Confirm and document reason for call. If symptomatic, describe symptoms. ---Caller states that she has lost hearing in her infected ear. She has an appointment at 4:15. Reports that she is not on abx.  Does the patient have any new or worsening symptoms? ---Yes  Will a triage be completed? ---Yes  Related visit to physician within the last 2 weeks? ---Yes  Does the PT have any chronic conditions? (i.e. diabetes, asthma, etc.) ---No  Is the patient pregnant or possibly pregnant? (Ask all females between the ages of 4212-55) ---No  Is this a behavioral health or substance abuse call? ---No     Guidelines    Guideline Title Affirmed Question Affirmed Notes  Hearing Loss or Change [1] SEVERE ear pain AND [2] not improved 2 hours after ibuprofen    Final Disposition User   See Physician within 4 Hours (or PCP triage) Odis LusterBowers, RN, Rhonda    Referrals  REFERRED TO PCP OFFICE   Disagree/Comply: Danella Maiersomply

## 2016-03-16 NOTE — Progress Notes (Signed)
Subjective:    Patient ID: [name] Rosales, female    DOB: [birth year], 26 y.o.   MRN: [id number]  HPI  [name] Rosales is a 26 year old female who presents today to establish care and discuss the problems mentioned below. Will obtain old records.  1) Ear Pain: Located to the left ear. History of ear infections. Also with nasal congestion. Her symptoms began 2 days ago. Her husband was diagnosed with influenza last week. She's taken Advil without improvement. She denies fevers, sore throat, cough. She has a prescription for Amoxil 500 mg at home from her prior PCP and started taking her first dose yesterday.    2) Environmental Allergies: Year around, especially with animals. She does have several pets at home. She's tried Careers adviser and Claritin without improvement. Zyrtec does help with symptoms but causes drowsiness. She is up with her 26 year old often during the night and doesn't want to feel drowsy.  Review of Systems  Constitutional: Negative for chills and fever.  HENT: Positive for congestion and ear pain.   Respiratory: Negative for cough and shortness of breath.   Cardiovascular: Negative for chest pain.  Gastrointestinal: Negative for nausea.  Allergic/Immunologic: Positive for environmental allergies.       Past Medical History:  Diagnosis Date  . Anemia   . Environmental allergies   . Medical history non-contributory      Social History   Social History  . Marital status: Married    Spouse name: N/A  . Number of children: N/A  . Years of education: N/A   Occupational History  . Not on file.   Social History Main Topics  . Smoking status: Never Smoker  . Smokeless tobacco: Never Used  . Alcohol use No     Comment: occas. prior to preg.  . Drug use: No  . Sexual activity: Yes    Birth control/ protection: None   Other Topics Concern  . Not on file   Social History Narrative   Married.   1 child.   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   Enjoys crafting, decorating, DIY.     Past Surgical History:  Procedure Laterality Date  . CESAREAN SECTION N/A 04/27/2014   Procedure: CESAREAN SECTION;  Surgeon: Carrington Clamp, MD;  Location: [facility];  Service: Obstetrics;  Laterality: N/A;  . CYSTOSCOPY     Age 23  . DILATION AND EVACUATION N/A 11/20/2012   Procedure: DILATATION AND EVACUATION WITH ULTRASOUND BEFORE SURGERY;  Surgeon: Freddrick March. Tenny Craw, MD;  Location: [facility];  Service: Gynecology;  Laterality: N/A;  . DILATION AND EVACUATION N/A 07/13/2013   Procedure: DILATATION AND EVACUATION;  Surgeon: Loney Laurence, MD;  Location: [facility];  Service: Gynecology;  Laterality: N/A;  . WRIST SURGERY Left    To remove a cyst    Family History  Problem Relation Age of Onset  . Hypothyroidism Mother   . Hypertension Father     Allergies  Allergen Reactions  . Codeine Nausea Only    No current outpatient prescriptions on file prior to visit.   No current facility-administered medications on file prior to visit.     BP 114/70   Pulse 88   Temp 98.2 F (36.8 C) (Oral)   Ht 5\' 5"  (1.651 m)   Wt 262 lb 12.8 oz (119.2 kg)   LMP 03/07/2016   SpO2 97%   BMI 43.73 kg/m    Objective:   Physical Exam  Constitutional: She appears well-nourished.  HENT:  Right Ear: Ear canal normal. Tympanic membrane is bulging.  Left Ear: Ear canal normal. Tympanic membrane is erythematous and bulging.  Nose: Right sinus exhibits no maxillary sinus tenderness and no frontal sinus tenderness. Left sinus exhibits no maxillary sinus tenderness and no frontal sinus tenderness.  Mouth/Throat: Oropharynx is clear and moist.  Erythema to left inner canal  Eyes: Conjunctivae are normal.  Neck: Neck supple.  Cardiovascular: Normal rate and regular rhythm.   Pulmonary/Chest: Effort normal and breath sounds normal. She has no wheezes. She has no rales.  Lymphadenopathy:    She has no cervical adenopathy.  Skin: Skin is warm and dry.            Assessment & Plan:  Acute Otitis Media:  History of recurrent ear infections. Ear pain x 2 days. No improvement with Ibuprofen. Has Rx for Amoxil from prior PCP, has taken 2 doses. Exam today consistent for AOM. Continue Amoxil 500 BID x k7 days. Try flonase for pressure. Ibuprofen PRN pain. Follow up PRN.  Morrie Sheldonlark,Jackalyn Haith Kendal, NP

## 2016-03-16 NOTE — Telephone Encounter (Signed)
Patient evaluated.  

## 2016-03-16 NOTE — Patient Instructions (Signed)
Start Amoxil 500 mg tablets. Take 1 tablet by mouth twice daily for 10 days.   Nasal Congestion/Ear Pressure: Try using Flonase (fluticasone) nasal spray. Instill 1 spray in each nostril twice daily.   You may also take Ibuprofen 600 mg three times daily as needed for pain/inflammation.  It was a pleasure to meet you today! Please don't hesitate to call me with any questions. Welcome to [facility]!

## 2016-03-16 NOTE — Assessment & Plan Note (Signed)
Year around, mostly animal dander. Will have her try Xyzal, Rx sent to pharmacy. Failed Claritin and Allegra, Zyrtec causes too much drowsiness.

## 2016-03-16 NOTE — Telephone Encounter (Signed)
Pt seen today by Mayra ReelKate Clark NP.

## 2016-03-29 ENCOUNTER — Ambulatory Visit: Payer: 59 | Admitting: Primary Care

## 2016-05-18 DIAGNOSIS — N62 Hypertrophy of breast: Secondary | ICD-10-CM

## 2016-05-18 HISTORY — DX: Hypertrophy of breast: N62

## 2016-05-19 ENCOUNTER — Encounter (HOSPITAL_BASED_OUTPATIENT_CLINIC_OR_DEPARTMENT_OTHER): Payer: Self-pay | Admitting: *Deleted

## 2016-05-20 ENCOUNTER — Ambulatory Visit: Payer: Self-pay | Admitting: Plastic Surgery

## 2016-05-24 ENCOUNTER — Encounter (HOSPITAL_BASED_OUTPATIENT_CLINIC_OR_DEPARTMENT_OTHER): Payer: Self-pay | Admitting: Anesthesiology

## 2016-05-24 NOTE — Anesthesia Preprocedure Evaluation (Addendum)
Anesthesia Evaluation    Reviewed: Allergy & Precautions, Patient's Chart, lab work & pertinent test results  Airway Mallampati: III  TM Distance: >3 FB Neck ROM: Full    Dental no notable dental hx.    Pulmonary neg pulmonary ROS,    Pulmonary exam normal breath sounds clear to auscultation       Cardiovascular negative cardio ROS Normal cardiovascular exam Rhythm:Regular Rate:Normal     Neuro/Psych negative neurological ROS  negative psych ROS   GI/Hepatic negative GI ROS, Neg liver ROS,   Endo/Other  negative endocrine ROSMorbid obesity  Renal/GU negative Renal ROS  negative genitourinary   Musculoskeletal negative musculoskeletal ROS (+)   Abdominal (+) + obese,   Peds negative pediatric ROS (+)  Hematology negative hematology ROS (+)   Anesthesia Other Findings Day of surgery medications reviewed with the patient.  Reproductive/Obstetrics negative OB ROS                                                             Anesthesia Evaluation    Reviewed: Allergy & Precautions, Patient's Chart, lab work & pertinent test results  Airway Mallampati: III  TM Distance: >3 FB Neck ROM: Full    Dental no notable dental hx.    Pulmonary neg pulmonary ROS,    Pulmonary exam normal breath sounds clear to auscultation       Cardiovascular negative cardio ROS Normal cardiovascular exam Rhythm:Regular Rate:Normal     Neuro/Psych negative neurological ROS  negative psych ROS   GI/Hepatic negative GI ROS, Neg liver ROS,   Endo/Other  negative endocrine ROSMorbid obesity  Renal/GU negative Renal ROS  negative genitourinary   Musculoskeletal negative musculoskeletal ROS (+)   Abdominal (+) + obese,   Peds negative pediatric ROS (+)  Hematology negative hematology ROS (+)   Anesthesia Other Findings Day of surgery medications reviewed with the patient.  Reproductive/Obstetrics negative OB ROS                            Anesthesia Physical Anesthesia Plan  ASA: III  Anesthesia Plan: General   Post-op Pain Management:    Induction: Intravenous  Airway Management Planned: Oral ETT  Additional Equipment:   Intra-op Plan:   Post-operative Plan: Extubation in OR  Informed Consent: I have reviewed the patients History and Physical, chart, labs and discussed the procedure including the risks, benefits and alternatives for the proposed anesthesia with the patient or authorized representative who has indicated his/her understanding and acceptance.   Dental advisory given  Plan Discussed with: CRNA  Anesthesia Plan Comments:        Anesthesia Quick Evaluation  Anesthesia Physical Anesthesia Plan  ASA: III  Anesthesia Plan: General   Post-op Pain Management:    Induction: Intravenous  Airway Management Planned: Oral ETT  Additional Equipment:   Intra-op Plan:   Post-operative Plan: Extubation in OR  Informed Consent: I have reviewed the patients History and Physical, chart, labs and discussed the procedure including the risks, benefits and alternatives for the proposed anesthesia with the patient or authorized representative who has indicated his/her understanding and acceptance.   Dental advisory given  Plan Discussed with: CRNA  Anesthesia Plan Comments:        Anesthesia Quick Evaluation

## 2016-05-25 ENCOUNTER — Encounter (HOSPITAL_BASED_OUTPATIENT_CLINIC_OR_DEPARTMENT_OTHER): Payer: Self-pay | Admitting: *Deleted

## 2016-05-25 ENCOUNTER — Encounter (HOSPITAL_BASED_OUTPATIENT_CLINIC_OR_DEPARTMENT_OTHER)
Admission: RE | Disposition: A | Discharge status: Home / Self Care | Payer: Self-pay | Source: Ambulatory Visit | Attending: Plastic Surgery

## 2016-05-25 ENCOUNTER — Ambulatory Visit (HOSPITAL_BASED_OUTPATIENT_CLINIC_OR_DEPARTMENT_OTHER)
Admission: RE | Admit: 2016-05-25 | Discharge: 2016-05-25 | Disposition: A | Discharge status: Home / Self Care | Payer: 59 | Source: Ambulatory Visit | Attending: Plastic Surgery | Admitting: Plastic Surgery

## 2016-05-25 DIAGNOSIS — Z6841 Body Mass Index (BMI) 40.0 and over, adult: Secondary | ICD-10-CM | POA: Insufficient documentation

## 2016-05-25 DIAGNOSIS — N62 Hypertrophy of breast: Secondary | ICD-10-CM | POA: Diagnosis present

## 2016-05-25 DIAGNOSIS — Z539 Procedure and treatment not carried out, unspecified reason: Secondary | ICD-10-CM | POA: Diagnosis not present

## 2016-05-25 HISTORY — DX: Hypertrophy of breast: N62

## 2016-05-25 HISTORY — DX: Family history of other specified conditions: Z84.89

## 2016-05-25 HISTORY — DX: Hyperesthesia: R20.3

## 2016-05-25 LAB — POCT HEMOGLOBIN-HEMACUE: Hemoglobin: 13.6 g/dL (ref 12.0–15.0)

## 2016-05-25 SURGERY — MAMMOPLASTY, REDUCTION
Anesthesia: General | Laterality: Bilateral

## 2016-05-25 MED ORDER — CEFAZOLIN SODIUM-DEXTROSE 2-4 GM/100ML-% IV SOLN
INTRAVENOUS | Status: AC
  Filled 2016-05-25: qty 100

## 2016-05-25 MED ORDER — DEXAMETHASONE SODIUM PHOSPHATE 10 MG/ML IJ SOLN
INTRAMUSCULAR | Status: AC
  Filled 2016-05-25: qty 1

## 2016-05-25 MED ORDER — FENTANYL CITRATE (PF) 100 MCG/2ML IJ SOLN
INTRAMUSCULAR | Status: AC
Start: 2016-05-25 — End: 2016-05-25
  Filled 2016-05-25: qty 4

## 2016-05-25 MED ORDER — BUPIVACAINE LIPOSOME 1.3 % IJ SUSP
INTRAMUSCULAR | Status: AC
  Filled 2016-05-25: qty 20

## 2016-05-25 MED ORDER — CEFAZOLIN SODIUM-DEXTROSE 1-4 GM/50ML-% IV SOLN
INTRAVENOUS | Status: AC
  Filled 2016-05-25: qty 50

## 2016-05-25 MED ORDER — CEFAZOLIN SODIUM-DEXTROSE 2-4 GM/100ML-% IV SOLN
2.0000 g | INTRAVENOUS | Status: DC
  Administered 2016-06-01: 3 g via INTRAVENOUS

## 2016-05-25 MED ORDER — MIDAZOLAM HCL 2 MG/2ML IJ SOLN
INTRAMUSCULAR | Status: AC
  Filled 2016-05-25: qty 2

## 2016-05-25 MED ORDER — LIDOCAINE-EPINEPHRINE 1 %-1:100000 IJ SOLN
INTRAMUSCULAR | Status: AC
  Filled 2016-05-25: qty 1

## 2016-05-25 MED ORDER — CHLORHEXIDINE GLUCONATE CLOTH 2 % EX PADS: 6.0000 | MEDICATED_PAD | Freq: Once | CUTANEOUS | Status: DC

## 2016-05-25 MED ORDER — SODIUM CHLORIDE 0.9 % IJ SOLN
INTRAMUSCULAR | Status: AC
  Filled 2016-05-25: qty 40

## 2016-05-25 MED ORDER — MIDAZOLAM HCL 2 MG/2ML IJ SOLN: 1.0000 mg | INTRAMUSCULAR | Status: DC | PRN

## 2016-05-25 MED ORDER — SCOPOLAMINE 1 MG/3DAYS TD PT72: 1.0000 | MEDICATED_PATCH | Freq: Once | TRANSDERMAL | Status: DC | PRN

## 2016-05-25 MED ORDER — ROCURONIUM BROMIDE 10 MG/ML (PF) SYRINGE
PREFILLED_SYRINGE | INTRAVENOUS | Status: AC
  Filled 2016-05-25: qty 5

## 2016-05-25 MED ORDER — LACTATED RINGERS IV SOLN
INTRAVENOUS | Status: DC
  Administered 2016-06-01 (×3): via INTRAVENOUS

## 2016-05-25 MED ORDER — ONDANSETRON HCL 4 MG/2ML IJ SOLN
INTRAMUSCULAR | Status: AC
  Filled 2016-05-25: qty 2

## 2016-05-25 MED ORDER — BACITRACIN ZINC 500 UNIT/GM EX OINT
TOPICAL_OINTMENT | CUTANEOUS | Status: AC
  Filled 2016-05-25: qty 28.35

## 2016-05-25 MED ORDER — LIDOCAINE 2% (20 MG/ML) 5 ML SYRINGE
INTRAMUSCULAR | Status: AC
  Filled 2016-05-25: qty 5

## 2016-05-25 MED ORDER — PROPOFOL 10 MG/ML IV BOLUS
INTRAVENOUS | Status: AC
  Filled 2016-05-25: qty 40

## 2016-05-25 MED ORDER — FENTANYL CITRATE (PF) 100 MCG/2ML IJ SOLN: 50.0000 ug | INTRAMUSCULAR | Status: DC | PRN

## 2016-05-25 MED ORDER — BUPIVACAINE HCL (PF) 0.25 % IJ SOLN
INTRAMUSCULAR | Status: AC
  Filled 2016-05-25: qty 120

## 2016-05-25 NOTE — Progress Notes (Signed)
Dr Sherald Hessontogiannis here to see patient and to cancel surgery for personal reasons. Pt will call her office to reschedule for another date.

## 2016-05-28 ENCOUNTER — Ambulatory Visit: Payer: Self-pay | Admitting: Plastic Surgery

## 2016-06-01 ENCOUNTER — Ambulatory Visit (HOSPITAL_BASED_OUTPATIENT_CLINIC_OR_DEPARTMENT_OTHER): Payer: 59 | Admitting: Anesthesiology

## 2016-06-01 ENCOUNTER — Encounter (HOSPITAL_BASED_OUTPATIENT_CLINIC_OR_DEPARTMENT_OTHER): Payer: Self-pay | Admitting: Certified Registered"

## 2016-06-01 ENCOUNTER — Ambulatory Visit (HOSPITAL_BASED_OUTPATIENT_CLINIC_OR_DEPARTMENT_OTHER)
Admission: RE | Admit: 2016-06-01 | Discharge: 2016-06-01 | Disposition: A | Discharge status: Home / Self Care | Payer: 59 | Source: Ambulatory Visit | Attending: Plastic Surgery | Admitting: Plastic Surgery

## 2016-06-01 ENCOUNTER — Encounter (HOSPITAL_BASED_OUTPATIENT_CLINIC_OR_DEPARTMENT_OTHER)
Admission: RE | Disposition: A | Discharge status: Home / Self Care | Payer: Self-pay | Source: Ambulatory Visit | Attending: Plastic Surgery

## 2016-06-01 DIAGNOSIS — N6011 Diffuse cystic mastopathy of right breast: Secondary | ICD-10-CM | POA: Diagnosis not present

## 2016-06-01 DIAGNOSIS — N62 Hypertrophy of breast: Secondary | ICD-10-CM | POA: Diagnosis not present

## 2016-06-01 HISTORY — PX: BREAST REDUCTION SURGERY: SHX8

## 2016-06-01 SURGERY — MAMMOPLASTY, REDUCTION
Anesthesia: General | Site: Breast | Laterality: Bilateral

## 2016-06-01 MED ORDER — MIDAZOLAM HCL 2 MG/2ML IJ SOLN
1.0000 mg | INTRAMUSCULAR | Status: DC | PRN
  Administered 2016-06-01: 2 mg via INTRAVENOUS

## 2016-06-01 MED ORDER — OXYCODONE HCL 5 MG PO TABS
ORAL_TABLET | ORAL | Status: AC
  Filled 2016-06-01: qty 1

## 2016-06-01 MED ORDER — FENTANYL CITRATE (PF) 100 MCG/2ML IJ SOLN
INTRAMUSCULAR | Status: AC
  Filled 2016-06-01: qty 2

## 2016-06-01 MED ORDER — ONDANSETRON HCL 4 MG/2ML IJ SOLN
INTRAMUSCULAR | Status: DC | PRN
  Administered 2016-06-01: 4 mg via INTRAVENOUS

## 2016-06-01 MED ORDER — ONDANSETRON HCL 4 MG/2ML IJ SOLN
INTRAMUSCULAR | Status: AC
  Filled 2016-06-01: qty 2

## 2016-06-01 MED ORDER — SODIUM CHLORIDE 0.9 % IJ SOLN
INTRAMUSCULAR | Status: AC
  Filled 2016-06-01: qty 50

## 2016-06-01 MED ORDER — SCOPOLAMINE 1 MG/3DAYS TD PT72: 1.0000 | MEDICATED_PATCH | Freq: Once | TRANSDERMAL | Status: DC | PRN

## 2016-06-01 MED ORDER — SODIUM CHLORIDE 0.9 % IJ SOLN
INTRAMUSCULAR | Status: DC | PRN
  Administered 2016-06-01: 80 mL via INTRAVENOUS

## 2016-06-01 MED ORDER — CHLORHEXIDINE GLUCONATE CLOTH 2 % EX PADS: 6.0000 | MEDICATED_PAD | Freq: Once | CUTANEOUS | Status: DC

## 2016-06-01 MED ORDER — BACITRACIN ZINC 500 UNIT/GM EX OINT
TOPICAL_OINTMENT | CUTANEOUS | Status: AC
  Filled 2016-06-01: qty 28.35

## 2016-06-01 MED ORDER — PROPOFOL 500 MG/50ML IV EMUL
INTRAVENOUS | Status: AC
  Filled 2016-06-01: qty 50

## 2016-06-01 MED ORDER — HYDROMORPHONE HCL 1 MG/ML IJ SOLN
0.2500 mg | INTRAMUSCULAR | Status: DC | PRN
  Administered 2016-06-01: 0.5 mg via INTRAVENOUS

## 2016-06-01 MED ORDER — LACTATED RINGERS IV SOLN: INTRAVENOUS | Status: DC

## 2016-06-01 MED ORDER — ROCURONIUM BROMIDE 10 MG/ML (PF) SYRINGE
PREFILLED_SYRINGE | INTRAVENOUS | Status: AC
  Filled 2016-06-01: qty 5

## 2016-06-01 MED ORDER — BUPIVACAINE LIPOSOME 1.3 % IJ SUSP
INTRAMUSCULAR | Status: DC | PRN
  Administered 2016-06-01: 20 mL

## 2016-06-01 MED ORDER — BUPIVACAINE LIPOSOME 1.3 % IJ SUSP
INTRAMUSCULAR | Status: AC
  Filled 2016-06-01: qty 20

## 2016-06-01 MED ORDER — CEFAZOLIN SODIUM-DEXTROSE 1-4 GM/50ML-% IV SOLN
INTRAVENOUS | Status: AC
  Filled 2016-06-01: qty 50

## 2016-06-01 MED ORDER — MEPERIDINE HCL 25 MG/ML IJ SOLN: 6.2500 mg | INTRAMUSCULAR | Status: DC | PRN

## 2016-06-01 MED ORDER — BUPIVACAINE HCL (PF) 0.25 % IJ SOLN
INTRAMUSCULAR | Status: AC
  Filled 2016-06-01: qty 60

## 2016-06-01 MED ORDER — LIDOCAINE-EPINEPHRINE 1 %-1:100000 IJ SOLN
INTRAMUSCULAR | Status: DC | PRN
  Administered 2016-06-01: 60 mL

## 2016-06-01 MED ORDER — MIDAZOLAM HCL 2 MG/2ML IJ SOLN
INTRAMUSCULAR | Status: AC
  Filled 2016-06-01: qty 2

## 2016-06-01 MED ORDER — SUGAMMADEX SODIUM 500 MG/5ML IV SOLN
INTRAVENOUS | Status: DC | PRN
  Administered 2016-06-01: 475 mg via INTRAVENOUS

## 2016-06-01 MED ORDER — LIDOCAINE-EPINEPHRINE 1 %-1:100000 IJ SOLN
INTRAMUSCULAR | Status: AC
  Filled 2016-06-01: qty 2

## 2016-06-01 MED ORDER — PROMETHAZINE HCL 25 MG/ML IJ SOLN
6.2500 mg | INTRAMUSCULAR | Status: DC | PRN
  Administered 2016-06-01: 6.25 mg via INTRAVENOUS

## 2016-06-01 MED ORDER — CEFAZOLIN SODIUM-DEXTROSE 1-4 GM/50ML-% IV SOLN: 1.0000 g | Freq: Once | INTRAVENOUS | Status: DC

## 2016-06-01 MED ORDER — HYDROMORPHONE HCL 1 MG/ML IJ SOLN
INTRAMUSCULAR | Status: AC
  Filled 2016-06-01: qty 1

## 2016-06-01 MED ORDER — PROMETHAZINE HCL 25 MG/ML IJ SOLN
INTRAMUSCULAR | Status: AC
  Filled 2016-06-01: qty 1

## 2016-06-01 MED ORDER — BUPIVACAINE HCL (PF) 0.25 % IJ SOLN
INTRAMUSCULAR | Status: AC
  Filled 2016-06-01: qty 90

## 2016-06-01 MED ORDER — DEXAMETHASONE SODIUM PHOSPHATE 10 MG/ML IJ SOLN
INTRAMUSCULAR | Status: AC
  Filled 2016-06-01: qty 1

## 2016-06-01 MED ORDER — ROCURONIUM BROMIDE 100 MG/10ML IV SOLN
INTRAVENOUS | Status: DC | PRN
  Administered 2016-06-01: 50 mg via INTRAVENOUS

## 2016-06-01 MED ORDER — BACITRACIN ZINC 500 UNIT/GM EX OINT
TOPICAL_OINTMENT | CUTANEOUS | Status: DC | PRN
Start: 2016-06-01 — End: 2016-06-01
  Administered 2016-06-01: 1 via TOPICAL

## 2016-06-01 MED ORDER — OXYCODONE HCL 5 MG PO TABS
5.0000 mg | ORAL_TABLET | Freq: Once | ORAL | Status: AC | PRN
  Administered 2016-06-01: 5 mg via ORAL

## 2016-06-01 MED ORDER — LIDOCAINE-EPINEPHRINE 1 %-1:100000 IJ SOLN
INTRAMUSCULAR | Status: DC | PRN
  Administered 2016-06-01: 30 mL

## 2016-06-01 MED ORDER — PROPOFOL 10 MG/ML IV BOLUS
INTRAVENOUS | Status: DC | PRN
  Administered 2016-06-01: 200 mg via INTRAVENOUS

## 2016-06-01 MED ORDER — CEFAZOLIN SODIUM-DEXTROSE 2-4 GM/100ML-% IV SOLN: 2.0000 g | INTRAVENOUS | Status: DC

## 2016-06-01 MED ORDER — DEXAMETHASONE SODIUM PHOSPHATE 4 MG/ML IJ SOLN
INTRAMUSCULAR | Status: DC | PRN
  Administered 2016-06-01: 10 mg via INTRAVENOUS

## 2016-06-01 MED ORDER — CEFAZOLIN SODIUM-DEXTROSE 2-4 GM/100ML-% IV SOLN
INTRAVENOUS | Status: AC
  Filled 2016-06-01: qty 100

## 2016-06-01 MED ORDER — FENTANYL CITRATE (PF) 100 MCG/2ML IJ SOLN
50.0000 ug | INTRAMUSCULAR | Status: AC | PRN
  Administered 2016-06-01 (×2): 50 ug via INTRAVENOUS
  Administered 2016-06-01: 25 ug via INTRAVENOUS
  Administered 2016-06-01 (×3): 50 ug via INTRAVENOUS
  Administered 2016-06-01: 25 ug via INTRAVENOUS
  Administered 2016-06-01: 50 ug via INTRAVENOUS

## 2016-06-01 MED ORDER — OXYCODONE HCL 5 MG/5ML PO SOLN: 5.0000 mg | Freq: Once | ORAL | Status: AC | PRN

## 2016-06-01 MED ORDER — LIDOCAINE 2% (20 MG/ML) 5 ML SYRINGE
INTRAMUSCULAR | Status: AC
  Filled 2016-06-01: qty 5

## 2016-06-01 SURGICAL SUPPLY — 70 items
APL SKNCLS STERI-STRIP NONHPOA (GAUZE/BANDAGES/DRESSINGS) ×2
BAG DECANTER FOR FLEXI CONT (MISCELLANEOUS) ×3 IMPLANT
BENZOIN TINCTURE PRP APPL 2/3 (GAUZE/BANDAGES/DRESSINGS) ×6 IMPLANT
BLADE KNIFE PERSONA 10 (BLADE) ×12 IMPLANT
BLADE KNIFE PERSONA 15 (BLADE) ×11 IMPLANT
BNDG GAUZE ELAST 4 BULKY (GAUZE/BANDAGES/DRESSINGS) ×6 IMPLANT
CANISTER SUCT 1200ML W/VALVE (MISCELLANEOUS) ×3 IMPLANT
CAP BOUFFANT 24 BLUE NURSES (PROTECTIVE WEAR) ×3 IMPLANT
CLOSURE STERI-STRIP 1/2X4 (GAUZE/BANDAGES/DRESSINGS) ×2
CLOSURE WOUND 1/2 X4 (GAUZE/BANDAGES/DRESSINGS) ×4
CLSR STERI-STRIP ANTIMIC 1/2X4 (GAUZE/BANDAGES/DRESSINGS) ×2 IMPLANT
COVER BACK TABLE 60X90IN (DRAPES) ×3 IMPLANT
COVER MAYO STAND STRL (DRAPES) ×3 IMPLANT
DECANTER SPIKE VIAL GLASS SM (MISCELLANEOUS) ×6 IMPLANT
DRAIN CHANNEL 10F 3/8 F FF (DRAIN) ×6 IMPLANT
DRAPE LAPAROSCOPIC ABDOMINAL (DRAPES) ×3 IMPLANT
DRAPE U-SHAPE 76X120 STRL (DRAPES) ×2 IMPLANT
DRSG EMULSION OIL 3X3 NADH (GAUZE/BANDAGES/DRESSINGS) ×6 IMPLANT
DRSG PAD ABDOMINAL 8X10 ST (GAUZE/BANDAGES/DRESSINGS) ×6 IMPLANT
ELECT REM PT RETURN 9FT ADLT (ELECTROSURGICAL) ×3
ELECTRODE REM PT RTRN 9FT ADLT (ELECTROSURGICAL) ×1 IMPLANT
EVACUATOR SILICONE 100CC (DRAIN) ×6 IMPLANT
FILTER 7/8 IN (FILTER) IMPLANT
GAUZE SPONGE 4X4 12PLY STRL (GAUZE/BANDAGES/DRESSINGS) ×6 IMPLANT
GAUZE SPONGE 4X4 12PLY STRL LF (GAUZE/BANDAGES/DRESSINGS) ×2 IMPLANT
GAUZE SPONGE 4X4 16PLY XRAY LF (GAUZE/BANDAGES/DRESSINGS) ×2 IMPLANT
GLOVE BIO SURGEON STRL SZ7 (GLOVE) ×3 IMPLANT
GLOVE BIOGEL PI IND STRL 7.0 (GLOVE) IMPLANT
GLOVE BIOGEL PI INDICATOR 7.0 (GLOVE) ×6
GLOVE ECLIPSE 6.5 STRL STRAW (GLOVE) ×6 IMPLANT
GOWN STRL REUS W/ TWL LRG LVL3 (GOWN DISPOSABLE) ×2 IMPLANT
GOWN STRL REUS W/TWL LRG LVL3 (GOWN DISPOSABLE) ×12
IV NS 250ML (IV SOLUTION) ×3
IV NS 250ML BAXH (IV SOLUTION) ×1 IMPLANT
NDL HYPO 25X1 1.5 SAFETY (NEEDLE) ×3 IMPLANT
NDL SAFETY ECLIPSE 18X1.5 (NEEDLE) ×1 IMPLANT
NDL SPNL 18GX3.5 QUINCKE PK (NEEDLE) ×1 IMPLANT
NEEDLE HYPO 18GX1.5 SHARP (NEEDLE) ×3
NEEDLE HYPO 25X1 1.5 SAFETY (NEEDLE) ×9 IMPLANT
NEEDLE SPNL 18GX3.5 QUINCKE PK (NEEDLE) ×3 IMPLANT
NS IRRIG 1000ML POUR BTL (IV SOLUTION) ×6 IMPLANT
PACK BASIN DAY SURGERY FS (CUSTOM PROCEDURE TRAY) ×3 IMPLANT
PIN SAFETY STERILE (MISCELLANEOUS) ×3 IMPLANT
SCRUB TECHNI CARE 4 OZ NO DYE (MISCELLANEOUS) ×3 IMPLANT
SLEEVE SCD COMPRESS KNEE MED (MISCELLANEOUS) ×3 IMPLANT
SPECIMEN JAR MEDIUM (MISCELLANEOUS) ×2 IMPLANT
SPECIMEN JAR X LARGE (MISCELLANEOUS) ×4 IMPLANT
SPONGE LAP 18X18 X RAY DECT (DISPOSABLE) ×11 IMPLANT
STAPLER VISISTAT 35W (STAPLE) ×6 IMPLANT
STRIP CLOSURE SKIN 1/2X4 (GAUZE/BANDAGES/DRESSINGS) ×8 IMPLANT
SUT ETHILON 3 0 PS 1 (SUTURE) ×3 IMPLANT
SUT MNCRL AB 3-0 PS2 18 (SUTURE) ×2 IMPLANT
SUT MNCRL AB 4-0 PS2 18 (SUTURE) ×6 IMPLANT
SUT MON AB 5-0 PS2 18 (SUTURE) ×6 IMPLANT
SUT PROLENE 2 0 CT2 30 (SUTURE) ×3 IMPLANT
SUT PROLENE 3 0 PS 1 (SUTURE) ×6 IMPLANT
SUT VLOC 90 P-14 23 (SUTURE) ×4 IMPLANT
SYR BULB IRRIGATION 50ML (SYRINGE) ×6 IMPLANT
SYR CONTROL 10ML LL (SYRINGE) ×6 IMPLANT
TAPE MEASURE VINYL STERILE (MISCELLANEOUS) ×3 IMPLANT
TOWEL OR 17X24 6PK STRL BLUE (TOWEL DISPOSABLE) ×9 IMPLANT
TOWEL OR NON WOVEN STRL DISP B (DISPOSABLE) ×2 IMPLANT
TRAY DSU PREP LF (CUSTOM PROCEDURE TRAY) ×3 IMPLANT
TRAY FOLEY BAG SILVER LF 14FR (SET/KITS/TRAYS/PACK) ×2 IMPLANT
TRAY FOLEY BAG SILVER LF 16FR (SET/KITS/TRAYS/PACK) ×3 IMPLANT
TUBE CONNECTING 20'X1/4 (TUBING) ×1
TUBE CONNECTING 20X1/4 (TUBING) ×2 IMPLANT
UNDERPAD 30X30 (UNDERPADS AND DIAPERS) ×6 IMPLANT
VAC PENCILS W/TUBING CLEAR (MISCELLANEOUS) ×3 IMPLANT
YANKAUER SUCT BULB TIP NO VENT (SUCTIONS) ×3 IMPLANT

## 2016-06-01 NOTE — Anesthesia Procedure Notes (Signed)
Procedure Name: Intubation Date/Time: 06/01/2016 7:52 AM Performed by: Claudis Giovanelli D Pre-anesthesia Checklist: Patient identified, Emergency Drugs available, Suction available and Patient being monitored Patient Re-evaluated:Patient Re-evaluated prior to inductionOxygen Delivery Method: Circle system utilized Preoxygenation: Pre-oxygenation with 100% oxygen Intubation Type: IV induction Ventilation: Mask ventilation without difficulty Laryngoscope Size: Mac and 3 Grade View: Grade I Tube type: Oral Tube size: 7.0 mm Number of attempts: 1 Airway Equipment and Method: Stylet and Oral airway Placement Confirmation: ETT inserted through vocal cords under direct vision,  positive ETCO2 and breath sounds checked- equal and bilateral Secured at: 21 cm Tube secured with: Tape Dental Injury: Teeth and Oropharynx as per pre-operative assessment

## 2016-06-01 NOTE — Brief Op Note (Signed)
06/01/2016  2:08 PM  PATIENT:  [name] Rosales  26 y.o. female  PRE-OPERATIVE DIAGNOSIS:  Bilateral Macromastia  POST-OPERATIVE DIAGNOSIS:  Bilateral Macromastia  PROCEDURE:  Procedure(s): BILATERAL MAMMARY REDUCTION  (BREAST) (Bilateral)  SURGEON:  Surgeon(s) and Role:    * Brier Firebaugh, Chales AbrahamsMary Ann, MD - Primary  ANESTHESIA:   general  EBL:  Total I/O In: -  Out: 665 [Urine:450; Drains:40; Blood:175]  BLOOD ADMINISTERED:none  DRAINS: (72F) Jackson-Pratt drain(s) with closed bulb suction in the Bilateral Breasts   LOCAL MEDICATIONS USED:  1.3% Exparel (total 266 mgs.)  SPECIMEN:  Source of Specimen:  Bilateral Breasts  DISPOSITION OF SPECIMEN:  PATHOLOGY  COUNTS:  YES  DICTATION: .Note written in EPIC  PLAN OF CARE: Discharge to home after PACU  PATIENT DISPOSITION:  PACU - hemodynamically stable.   Delay start of Pharmacological VTE agent (>24hrs) due to surgical blood loss or risk of bleeding: not applicable

## 2016-06-01 NOTE — Op Note (Signed)
OPERATIVE REPORT  06/01/2016  Maritssa L Makris  PREOPERATIVE DIAGNOSIS:  Bilateral macromastia.  POSTOPERATIVE DIAGNOSIS:  Bilateral macromastia.  PROCEDURE:  Bilateral reduction mammoplasties.  ATTENDING SURGEON:  Eloise LevelsMary Ann Kasha Howeth, MD  ANESTHESIA:  General.  ANESTHESIOLOGIST:  , MD  COMPLICATIONS:  None.  INDICATIONS FOR THE PROCEDURE:  The patient is a 26 y.o. female who has bilateral macromastia that is clinically symptomatic.  She presents to undergo bilateral reduction mammoplasties.  DESCRIPTION OF PROCEDURE:  The patient was marked in preop holding area in a pattern of Wise for the future bilateral reduction mammoplasties. She was then taken back to the OR, placed on the table in supine position.  After adequate general anesthesia was obtained, the patient's chest was prepped with Techni-Care and draped in sterile fashion.  The bases of the breasts have been infiltrated with 1% lidocaine with epinephrine.  After adequate hemostasis and anesthesia taken effect, the procedure was begun.  Both of the breast reductions were performed in the following similar manner.  The nipple-areolar complex was marked with a 45-mm nipple marker.  The skin was then incised and deepithelialized around the nipple-areolar complex down to the inframammary crease in the inferior pedicle pattern.  Next, the medial, superior, and lateral skin flaps were elevated down to the chest wall.  Excess fat and glandular tissue removed from the inferior pedicle.  The nipple-areolar complex was examined and found to be pink and viable.  The wound was irrigated with saline irrigation.  Meticulous hemostasis was obtained with the Bovie electrocautery.  Inferior pedicle was centralized using 3-0 Prolene suture.  A #10 JP flat fully fluted drain was placed into the wound. The skin flaps were brought together at the inverted T junction with a 2- 0 Prolene suture.  The incisions were stapled for  temporary closure. The breasts compared and found to have good shape and symmetry.  The incisions were then closed from the medial aspect of the JP drain to the medial aspect of the [facility] incision by first placing a few 3-0 Monocryl sutures to tack together the dermal layer, and then both the dermal and cuticular layer were closed in a single layer using a 2-0 V-lock PDO barbed suture.  Lateral to the JP drain incision was closed using 3-0 Monocryl in the dermal layer, followed by 3-0 Monocryl running intracuticular stitch on the skin.  The vertical limb of the Wise pattern was closed in the dermal layer using 3-0 Monocryl suture.  The patient was placed in the upright position.  The future location of the nipple-areolar complexes was marked on both breast mounds using the 45-mm nipple marker.  She was then placed back in the recumbent position.  Both of the nipple areolar complexes were brought out onto the breast mounds in the following similar manner.  The skin was incised as marked and removed in full thickness into the subcutaneous tissues.  The nipple- areolar complex was examined, found to be pink and viable, then brought out through this aperture and sewn in place using 4-0 Monocryl in the dermal layer, followed by 5-0 Monocryl running intracuticular stitch on the skin.  This 5-0 Monocryl suture was then brought down to close the cuticular layer of the vertical limb as well.  The JP drain was sewn in place using 3-0 nylon suture.  The pectoralis major muscle and fascia along with the breast and chest soft tissues were then infiltrated with 1% Exparel (total 266 mg).  [facility] incision was also infiltrated  with the Exparel in order to give the patient postoperative pain control.  The incisions were dressed with benzoin, Steri-Strips, and the nipples dressed with bacitracin ointment and Adaptic.  4x4s were placed over the incisions and ABD pads in the axillary areas.  The patient  was placed into a light postoperative support bra.  There were no complications. The patient tolerated the procedure well.  The final needle, sponge counts were reported to be correct at the end of the case.  The patient was then recovered without complications.  Both the patient and her family were given proper postoperative wound care instructions. She was then discharged home in the care of her family in stable condition.  Follow up will be with me in a few days in the office.         Eloise Levels, M.D.  06/01/2016 2:10 PM

## 2016-06-01 NOTE — Anesthesia Postprocedure Evaluation (Signed)
Anesthesia Post Note  Patient: Callan L Makris  Procedure(s) Performed: Procedure(s) (LRB): BILATERAL MAMMARY REDUCTION  (BREAST) (Bilateral)  Patient location during evaluation: PACU Anesthesia Type: General Level of consciousness: awake and alert Pain management: pain level controlled Vital Signs Assessment: post-procedure vital signs reviewed and stable Respiratory status: spontaneous breathing, nonlabored ventilation and respiratory function stable Cardiovascular status: blood pressure returned to baseline and stable Postop Assessment: no signs of nausea or vomiting Anesthetic complications: no       Last Vitals:  Vitals:   06/01/16 1330 06/01/16 1345  BP: 130/80 130/82  Pulse: 80 74  Resp: 20 17  Temp:      Last Pain:  Vitals:   06/01/16 1345  TempSrc:   PainSc: Asleep                 Lowella CurbWarren Ray Kellene Mccleary

## 2016-06-01 NOTE — H&P (Signed)
  H&P faxed to surgical center.  -History and Physical Reviewed  -Patient has been re-examined  -No change in the plan of care  CONTOGIANNIS,MARY A    

## 2016-06-01 NOTE — Transfer of Care (Signed)
Immediate Anesthesia Transfer of Care Note  Patient: [name] Rosales  Procedure(s) Performed: Procedure(s): BILATERAL MAMMARY REDUCTION  (BREAST) (Bilateral)  Patient Location: PACU  Anesthesia Type:General  Level of Consciousness: awake, alert , oriented and patient cooperative  Airway & Oxygen Therapy: Patient Spontanous Breathing and Patient connected to face mask oxygen  Post-op Assessment: Report given to RN and Post -op Vital signs reviewed and stable  Post vital signs: Reviewed and stable  Last Vitals:  Vitals:   06/01/16 0644  BP: 118/85  Pulse: 73  Resp: 20  Temp: 36.8 C    Last Pain:  Vitals:   06/01/16 0644  TempSrc: Oral         Complications: No apparent anesthesia complications

## 2016-06-01 NOTE — Discharge Instructions (Signed)
F/u with Dr. Sherald Hessontogiannis as instructed. Keep dressing clean and dry. No lifting anything heavier than 5 lb. Regular diet Take Medications as prescribed.   Call your surgeon if you experience:   1.  Fever over 101.0. 2.  Inability to urinate. 3.  Nausea and/or vomiting. 4.  Extreme swelling or bruising at the surgical site. 5.  Continued bleeding from the incision. 6.  Increased pain, redness or drainage from the incision. 7.  Problems related to your pain medication. 8.  Any problems and/or concerns   Post Anesthesia Home Care Instructions  Activity: Get plenty of rest for the remainder of the day. A responsible individual must stay with you for 24 hours following the procedure.  For the next 24 hours, DO NOT: -Drive a car -Advertising copywriterperate machinery -Drink alcoholic beverages -Take any medication unless instructed by your physician -Make any legal decisions or sign important papers.  Meals: Start with liquid foods such as gelatin or soup. Progress to regular foods as tolerated. Avoid greasy, spicy, heavy foods. If nausea and/or vomiting occur, drink only clear liquids until the nausea and/or vomiting subsides. Call your physician if vomiting continues.  Special Instructions/Symptoms: Your throat may feel dry or sore from the anesthesia or the breathing tube placed in your throat during surgery. If this causes discomfort, gargle with warm salt water. The discomfort should disappear within 24 hours.  If you had a scopolamine patch placed behind your ear for the management of post- operative nausea and/or vomiting:  1. The medication in the patch is effective for 72 hours, after which it should be removed.  Wrap patch in a tissue and discard in the trash. Wash hands thoroughly with soap and water. 2. You may remove the patch earlier than 72 hours if you experience unpleasant side effects which may include dry mouth, dizziness or visual disturbances. 3. Avoid touching the patch. Wash  your hands with soap and water after contact with the patch.     Information for Discharge Teaching: EXPAREL (bupivacaine liposome injectable suspension)   Your surgeon gave you EXPAREL(bupivacaine) in your surgical incision to help control your pain after surgery.   EXPAREL is a local anesthetic that provides pain relief by numbing the tissue around the surgical site.  EXPAREL is designed to release pain medication over time and can control pain for up to 72 hours.  Depending on how you respond to EXPAREL, you may require less pain medication during your recovery.  Possible side effects:  Temporary loss of sensation or ability to move in the area where bupivacaine was injected.  Nausea, vomiting, constipation  Rarely, numbness and tingling in your mouth or lips, lightheadedness, or anxiety may occur.  Call your doctor right away if you think you may be experiencing any of these sensations, or if you have other questions regarding possible side effects.  Follow all other discharge instructions given to you by your surgeon or nurse. Eat a healthy diet and drink plenty of water or other fluids.  If you return to the hospital for any reason within 96 hours following the administration of EXPAREL, please inform your health care providers.   About my Jackson-Pratt Bulb Drain  What is a Jackson-Pratt bulb? A Jackson-Pratt is a soft, round device used to collect drainage. It is connected to a long, thin drainage catheter, which is held in place by one or two small stiches near your surgical incision site. When the bulb is squeezed, it forms a vacuum, forcing the drainage to empty  into the bulb.  Emptying the Jackson-Pratt bulb- To empty the bulb: 1. Release the plug on the top of the bulb. 2. Pour the bulb's contents into a measuring container which your nurse will provide. 3. Record the time emptied and amount of drainage. Empty the drain(s) as often as your     doctor or nurse  recommends.  Date                  Time                    Amount (Drain 1)                 Amount (Drain 2)  _____________________________________________________________________  _____________________________________________________________________  _____________________________________________________________________  _____________________________________________________________________  _____________________________________________________________________  _____________________________________________________________________  _____________________________________________________________________  _____________________________________________________________________  Squeezing the Jackson-Pratt Bulb- To squeeze the bulb: 1. Make sure the plug at the top of the bulb is open. 2. Squeeze the bulb tightly in your fist. You will hear air squeezing from the bulb. 3. Replace the plug while the bulb is squeezed. 4. Use a safety pin to attach the bulb to your clothing. This will keep the catheter from     pulling at the bulb insertion site.  When to call your doctor- Call your doctor if:  Drain site becomes red, swollen or hot.  You have a fever greater than 101 degrees F.  There is oozing at the drain site.  Drain falls out (apply a guaze bandage over the drain hole and secure it with tape).  Drainage increases daily not related to activity patterns. (You will usually have more drainage when you are active than when you are resting.)  Drainage has a bad odor.

## 2016-06-02 ENCOUNTER — Encounter (HOSPITAL_BASED_OUTPATIENT_CLINIC_OR_DEPARTMENT_OTHER): Payer: Self-pay | Admitting: Plastic Surgery

## 2016-06-19 ENCOUNTER — Other Ambulatory Visit: Payer: Self-pay | Admitting: Primary Care

## 2016-06-19 DIAGNOSIS — Z9109 Other allergy status, other than to drugs and biological substances: Secondary | ICD-10-CM

## 2019-09-28 ENCOUNTER — Encounter: Payer: Self-pay | Admitting: Obstetrics

## 2019-10-24 LAB — OB RESULTS CONSOLE RPR: RPR: NONREACTIVE

## 2019-10-24 LAB — OB RESULTS CONSOLE ANTIBODY SCREEN: Antibody Screen: NEGATIVE

## 2019-10-24 LAB — OB RESULTS CONSOLE HIV ANTIBODY (ROUTINE TESTING): HIV: NONREACTIVE

## 2019-10-24 LAB — OB RESULTS CONSOLE ABO/RH: RH Type: POSITIVE

## 2019-10-24 LAB — OB RESULTS CONSOLE HEPATITIS B SURFACE ANTIGEN: Hepatitis B Surface Ag: NEGATIVE

## 2019-10-24 LAB — OB RESULTS CONSOLE RUBELLA ANTIBODY, IGM: Rubella: IMMUNE

## 2020-04-15 ENCOUNTER — Encounter (HOSPITAL_COMMUNITY): Payer: Self-pay

## 2020-04-15 ENCOUNTER — Encounter (HOSPITAL_COMMUNITY): Payer: Self-pay | Admitting: *Deleted

## 2020-04-15 NOTE — Patient Instructions (Signed)
PHOEBIE SHAD  04/15/2020   Your procedure is scheduled on:  04/28/2020  Arrive at 1030 at Entrance C on [employer] at [facility]  and [employer]. You are invited to use the FREE valet parking or use the Visitor's parking deck.  Pick up the phone at the desk and dial [phone].  Call this number if you have problems the morning of surgery: [phone]  Remember:   Do not eat food:(After Midnight) Desps de medianoche.  Do not drink clear liquids: (After Midnight) Desps de medianoche.  Take these medicines the morning of surgery with A SIP OF WATER:  none   Do not wear jewelry, make-up or nail polish.  Do not wear lotions, powders, or perfumes. Do not wear deodorant.  Do not shave 48 hours prior to surgery.  Do not bring valuables to the hospital.  [facility] is not   responsible for any belongings or valuables brought to the hospital.  Contacts, dentures or bridgework may not be worn into surgery.  Leave suitcase in the car. After surgery it may be brought to your room.  For patients admitted to the hospital, checkout time is 11:00 AM the day of              discharge.      Please read over the following fact sheets that you were given:     Preparing for Surgery

## 2020-04-25 ENCOUNTER — Other Ambulatory Visit
Admission: RE | Admit: 2020-04-25 | Discharge: 2020-04-25 | Disposition: A | Discharge status: Home / Self Care | Payer: Medicaid Other | Source: Ambulatory Visit | Attending: Obstetrics and Gynecology | Admitting: Obstetrics and Gynecology

## 2020-04-25 ENCOUNTER — Other Ambulatory Visit: Payer: Self-pay

## 2020-04-25 ENCOUNTER — Encounter (HOSPITAL_COMMUNITY)
Admission: RE | Admit: 2020-04-25 | Discharge: 2020-04-25 | Disposition: A | Discharge status: Home / Self Care | Payer: Medicaid Other | Source: Ambulatory Visit | Attending: Obstetrics and Gynecology | Admitting: Obstetrics and Gynecology

## 2020-04-25 ENCOUNTER — Other Ambulatory Visit (HOSPITAL_COMMUNITY): Payer: Medicaid Other

## 2020-04-25 DIAGNOSIS — Z01812 Encounter for preprocedural laboratory examination: Secondary | ICD-10-CM | POA: Insufficient documentation

## 2020-04-25 DIAGNOSIS — Z20822 Contact with and (suspected) exposure to covid-19: Secondary | ICD-10-CM | POA: Insufficient documentation

## 2020-04-25 LAB — TYPE AND SCREEN
ABO/RH(D): A POS
Antibody Screen: NEGATIVE

## 2020-04-25 LAB — CBC
HCT: 36.4 % (ref 36.0–46.0)
Hemoglobin: 11.8 g/dL — ABNORMAL LOW (ref 12.0–15.0)
MCH: 28 pg (ref 26.0–34.0)
MCHC: 32.4 g/dL (ref 30.0–36.0)
MCV: 86.3 fL (ref 80.0–100.0)
Platelets: 241 10*3/uL (ref 150–400)
RBC: 4.22 MIL/uL (ref 3.87–5.11)
RDW: 16.8 % — ABNORMAL HIGH (ref 11.5–15.5)
WBC: 6.6 10*3/uL (ref 4.0–10.5)
nRBC: 0 % (ref 0.0–0.2)

## 2020-04-25 LAB — SARS CORONAVIRUS 2 (TAT 6-24 HRS): SARS Coronavirus 2: NEGATIVE

## 2020-04-26 LAB — RPR: RPR Ser Ql: NONREACTIVE

## 2020-04-27 NOTE — Anesthesia Preprocedure Evaluation (Addendum)
Anesthesia Evaluation  Patient identified by MRN, date of birth, ID band Patient awake    Reviewed: Allergy & Precautions, NPO status , Patient's Chart, lab work & pertinent test results  Airway Mallampati: II  TM Distance: >3 FB Neck ROM: Full    Dental no notable dental hx. (+) Teeth Intact, Dental Advisory Given   Pulmonary    Pulmonary exam normal breath sounds clear to auscultation       Cardiovascular negative cardio ROS Normal cardiovascular exam Rhythm:Regular Rate:Normal     Neuro/Psych    GI/Hepatic Neg liver ROS,   Endo/Other  negative endocrine ROS  Renal/GU negative Renal ROS     Musculoskeletal   Abdominal (+) + obese,   Peds  Hematology Lab Results      Component                Value               Date                      WBC                      6.6                 04/25/2020                HGB                      11.8 (L)            04/25/2020                HCT                      36.4                04/25/2020                MCV                      86.3                04/25/2020                PLT                      241                 04/25/2020              Anesthesia Other Findings All: codeine  Reproductive/Obstetrics (+) Pregnancy                           Anesthesia Physical Anesthesia Plan  ASA: III  Anesthesia Plan: Spinal   Post-op Pain Management:    Induction:   PONV Risk Score and Plan: Treatment may vary due to age or medical condition  Airway Management Planned: Natural Airway and Nasal Cannula  Additional Equipment: None  Intra-op Plan:   Post-operative Plan:   Informed Consent: I have reviewed the patients History and Physical, chart, labs and discussed the procedure including the risks, benefits and alternatives for the proposed anesthesia with the patient or authorized representative who has indicated his/her understanding and  acceptance.     Dental advisory given  Plan Discussed with: CRNA and  Anesthesiologist  Anesthesia Plan Comments: (   G4P1 for repeat c/s and PPTL)      Anesthesia Quick Evaluation

## 2020-04-28 ENCOUNTER — Other Ambulatory Visit: Payer: Self-pay

## 2020-04-28 ENCOUNTER — Encounter (HOSPITAL_COMMUNITY): Payer: Self-pay | Admitting: Obstetrics and Gynecology

## 2020-04-28 ENCOUNTER — Encounter (HOSPITAL_COMMUNITY)
Admission: RE | Disposition: A | Discharge status: Home / Self Care | Payer: Self-pay | Source: Home / Self Care | Attending: Obstetrics and Gynecology

## 2020-04-28 ENCOUNTER — Inpatient Hospital Stay (HOSPITAL_COMMUNITY)
Admission: RE | Admit: 2020-04-28 | Discharge: 2020-04-30 | DRG: 785 | Disposition: A | Discharge status: Home / Self Care | Payer: Medicaid Other | Attending: Obstetrics and Gynecology | Admitting: Obstetrics and Gynecology

## 2020-04-28 ENCOUNTER — Inpatient Hospital Stay (HOSPITAL_COMMUNITY): Payer: Medicaid Other | Admitting: Anesthesiology

## 2020-04-28 DIAGNOSIS — Z3A39 39 weeks gestation of pregnancy: Secondary | ICD-10-CM

## 2020-04-28 DIAGNOSIS — O34211 Maternal care for low transverse scar from previous cesarean delivery: Principal | ICD-10-CM | POA: Diagnosis present

## 2020-04-28 DIAGNOSIS — Z98891 History of uterine scar from previous surgery: Secondary | ICD-10-CM

## 2020-04-28 DIAGNOSIS — Z302 Encounter for sterilization: Secondary | ICD-10-CM

## 2020-04-28 DIAGNOSIS — O99214 Obesity complicating childbirth: Secondary | ICD-10-CM | POA: Diagnosis present

## 2020-04-28 SURGERY — Surgical Case
Anesthesia: Spinal

## 2020-04-28 MED ORDER — STERILE WATER FOR IRRIGATION IR SOLN
Status: DC | PRN
Start: 2020-04-28 — End: 2020-04-28
  Administered 2020-04-28: 1

## 2020-04-28 MED ORDER — PHENYLEPHRINE HCL-NACL 20-0.9 MG/250ML-% IV SOLN
INTRAVENOUS | Status: AC
  Filled 2020-04-28: qty 250

## 2020-04-28 MED ORDER — SCOPOLAMINE 1 MG/3DAYS TD PT72
1.0000 | MEDICATED_PATCH | Freq: Once | TRANSDERMAL | Status: DC
  Administered 2020-04-28: 1.5 mg via TRANSDERMAL

## 2020-04-28 MED ORDER — DIBUCAINE (PERIANAL) 1 % EX OINT: 1.0000 "application " | TOPICAL_OINTMENT | CUTANEOUS | Status: DC | PRN

## 2020-04-28 MED ORDER — ONDANSETRON HCL 4 MG/2ML IJ SOLN
INTRAMUSCULAR | Status: DC | PRN
  Administered 2020-04-28: 4 mg via INTRAVENOUS

## 2020-04-28 MED ORDER — OXYTOCIN-SODIUM CHLORIDE 30-0.9 UT/500ML-% IV SOLN
INTRAVENOUS | Status: AC
  Filled 2020-04-28: qty 500

## 2020-04-28 MED ORDER — SODIUM CHLORIDE 0.9% FLUSH: 3.0000 mL | INTRAVENOUS | Status: DC | PRN

## 2020-04-28 MED ORDER — NALOXONE HCL 0.4 MG/ML IJ SOLN: 0.4000 mg | INTRAMUSCULAR | Status: DC | PRN

## 2020-04-28 MED ORDER — DEXTROSE 5 % IV SOLN
INTRAVENOUS | Status: AC
  Filled 2020-04-28: qty 3000

## 2020-04-28 MED ORDER — OXYCODONE HCL 5 MG/5ML PO SOLN: 5.0000 mg | Freq: Once | ORAL | Status: DC | PRN

## 2020-04-28 MED ORDER — BUPIVACAINE IN DEXTROSE 0.75-8.25 % IT SOLN
INTRATHECAL | Status: DC | PRN
  Administered 2020-04-28: 1.7 mL via INTRATHECAL

## 2020-04-28 MED ORDER — FENTANYL CITRATE (PF) 100 MCG/2ML IJ SOLN
INTRAMUSCULAR | Status: AC
  Filled 2020-04-28: qty 2

## 2020-04-28 MED ORDER — DIPHENHYDRAMINE HCL 25 MG PO CAPS: 25.0000 mg | ORAL_CAPSULE | ORAL | Status: DC | PRN

## 2020-04-28 MED ORDER — MENTHOL 3 MG MT LOZG
1.0000 | LOZENGE | OROMUCOSAL | Status: DC | PRN
  Administered 2020-04-29: 3 mg via ORAL
  Filled 2020-04-28: qty 9

## 2020-04-28 MED ORDER — ONDANSETRON HCL 4 MG/2ML IJ SOLN: 4.0000 mg | Freq: Three times a day (TID) | INTRAMUSCULAR | Status: DC | PRN

## 2020-04-28 MED ORDER — COCONUT OIL OIL: 1.0000 "application " | TOPICAL_OIL | Status: DC | PRN

## 2020-04-28 MED ORDER — SENNOSIDES-DOCUSATE SODIUM 8.6-50 MG PO TABS
2.0000 | ORAL_TABLET | Freq: Every day | ORAL | Status: DC
  Administered 2020-04-29 – 2020-04-30 (×2): 2 via ORAL
  Filled 2020-04-28 (×2): qty 2

## 2020-04-28 MED ORDER — HYDROMORPHONE HCL 2 MG PO TABS: 2.0000 mg | ORAL_TABLET | ORAL | Status: DC | PRN

## 2020-04-28 MED ORDER — IBUPROFEN 800 MG PO TABS
800.0000 mg | ORAL_TABLET | Freq: Three times a day (TID) | ORAL | Status: DC
  Administered 2020-04-28 – 2020-04-30 (×5): 800 mg via ORAL
  Filled 2020-04-28 (×5): qty 1

## 2020-04-28 MED ORDER — CEFAZOLIN SODIUM-DEXTROSE 2-4 GM/100ML-% IV SOLN: 2.0000 g | INTRAVENOUS | Status: DC

## 2020-04-28 MED ORDER — NALBUPHINE HCL 10 MG/ML IJ SOLN: 5.0000 mg | Freq: Once | INTRAMUSCULAR | Status: AC | PRN

## 2020-04-28 MED ORDER — SODIUM CHLORIDE 0.9 % IR SOLN
Status: DC | PRN
  Administered 2020-04-28: 1

## 2020-04-28 MED ORDER — PHENYLEPHRINE HCL-NACL 20-0.9 MG/250ML-% IV SOLN
INTRAVENOUS | Status: DC | PRN
  Administered 2020-04-28: 60 ug/min via INTRAVENOUS

## 2020-04-28 MED ORDER — OXYTOCIN-SODIUM CHLORIDE 30-0.9 UT/500ML-% IV SOLN
2.5000 [IU]/h | INTRAVENOUS | Status: AC
  Administered 2020-04-28: 2.5 [IU]/h via INTRAVENOUS
  Filled 2020-04-28: qty 500

## 2020-04-28 MED ORDER — SOD CITRATE-CITRIC ACID 500-334 MG/5ML PO SOLN
ORAL | Status: AC
  Filled 2020-04-28: qty 30

## 2020-04-28 MED ORDER — METHYLERGONOVINE MALEATE 0.2 MG/ML IJ SOLN: 0.2000 mg | INTRAMUSCULAR | Status: DC | PRN

## 2020-04-28 MED ORDER — FENTANYL CITRATE (PF) 100 MCG/2ML IJ SOLN
INTRAMUSCULAR | Status: DC | PRN
  Administered 2020-04-28: 15 ug via INTRATHECAL

## 2020-04-28 MED ORDER — DEXTROSE 5 % IV SOLN
3.0000 g | INTRAVENOUS | Status: AC
  Administered 2020-04-28: 3 g via INTRAVENOUS

## 2020-04-28 MED ORDER — ONDANSETRON HCL 4 MG/2ML IJ SOLN: 4.0000 mg | Freq: Once | INTRAMUSCULAR | Status: DC | PRN

## 2020-04-28 MED ORDER — NALBUPHINE HCL 10 MG/ML IJ SOLN: 5.0000 mg | INTRAMUSCULAR | Status: DC | PRN

## 2020-04-28 MED ORDER — HYDROMORPHONE HCL 1 MG/ML IJ SOLN: 0.2000 mg | INTRAMUSCULAR | Status: DC | PRN

## 2020-04-28 MED ORDER — SCOPOLAMINE 1 MG/3DAYS TD PT72
MEDICATED_PATCH | TRANSDERMAL | Status: AC
  Filled 2020-04-28: qty 1

## 2020-04-28 MED ORDER — DIPHENHYDRAMINE HCL 50 MG/ML IJ SOLN: 12.5000 mg | INTRAMUSCULAR | Status: DC | PRN

## 2020-04-28 MED ORDER — ZOLPIDEM TARTRATE 5 MG PO TABS: 5.0000 mg | ORAL_TABLET | Freq: Every evening | ORAL | Status: DC | PRN

## 2020-04-28 MED ORDER — ACETAMINOPHEN 500 MG PO TABS
1000.0000 mg | ORAL_TABLET | Freq: Four times a day (QID) | ORAL | Status: DC
  Administered 2020-04-28 – 2020-04-30 (×7): 1000 mg via ORAL
  Filled 2020-04-28 (×8): qty 2

## 2020-04-28 MED ORDER — OXYTOCIN-SODIUM CHLORIDE 30-0.9 UT/500ML-% IV SOLN
INTRAVENOUS | Status: DC | PRN
  Administered 2020-04-28: 250 mL/h via INTRAVENOUS

## 2020-04-28 MED ORDER — MORPHINE SULFATE (PF) 0.5 MG/ML IJ SOLN
INTRAMUSCULAR | Status: DC | PRN
  Administered 2020-04-28: 150 ug via INTRATHECAL

## 2020-04-28 MED ORDER — WITCH HAZEL-GLYCERIN EX PADS: 1.0000 "application " | MEDICATED_PAD | CUTANEOUS | Status: DC | PRN

## 2020-04-28 MED ORDER — DIPHENHYDRAMINE HCL 25 MG PO CAPS
25.0000 mg | ORAL_CAPSULE | Freq: Four times a day (QID) | ORAL | Status: DC | PRN
Start: 2020-04-28 — End: 2020-04-30

## 2020-04-28 MED ORDER — SIMETHICONE 80 MG PO CHEW
80.0000 mg | CHEWABLE_TABLET | Freq: Three times a day (TID) | ORAL | Status: DC
  Administered 2020-04-29 (×3): 80 mg via ORAL
  Filled 2020-04-28 (×4): qty 1

## 2020-04-28 MED ORDER — FERROUS SULFATE 325 (65 FE) MG PO TABS
325.0000 mg | ORAL_TABLET | Freq: Two times a day (BID) | ORAL | Status: DC
  Administered 2020-04-29 – 2020-04-30 (×3): 325 mg via ORAL
  Filled 2020-04-28 (×3): qty 1

## 2020-04-28 MED ORDER — OXYCODONE HCL 5 MG PO TABS: 5.0000 mg | ORAL_TABLET | Freq: Once | ORAL | Status: DC | PRN

## 2020-04-28 MED ORDER — NALOXONE HCL 4 MG/10ML IJ SOLN
1.0000 ug/kg/h | INTRAVENOUS | Status: DC | PRN
  Filled 2020-04-28: qty 5

## 2020-04-28 MED ORDER — KETOROLAC TROMETHAMINE 30 MG/ML IJ SOLN
30.0000 mg | Freq: Once | INTRAMUSCULAR | Status: AC | PRN
  Administered 2020-04-28: 30 mg via INTRAVENOUS

## 2020-04-28 MED ORDER — LACTATED RINGERS IV SOLN: INTRAVENOUS | Status: DC

## 2020-04-28 MED ORDER — MORPHINE SULFATE (PF) 0.5 MG/ML IJ SOLN
INTRAMUSCULAR | Status: AC
  Filled 2020-04-28: qty 10

## 2020-04-28 MED ORDER — METHYLERGONOVINE MALEATE 0.2 MG PO TABS: 0.2000 mg | ORAL_TABLET | ORAL | Status: DC | PRN

## 2020-04-28 MED ORDER — ENOXAPARIN SODIUM 80 MG/0.8ML ~~LOC~~ SOLN
70.0000 mg | SUBCUTANEOUS | Status: DC
  Administered 2020-04-30: 70 mg via SUBCUTANEOUS
  Filled 2020-04-28 (×2): qty 0.8

## 2020-04-28 MED ORDER — PRENATAL MULTIVITAMIN CH
1.0000 | ORAL_TABLET | Freq: Every day | ORAL | Status: DC
  Administered 2020-04-29 – 2020-04-30 (×2): 1 via ORAL
  Filled 2020-04-28 (×2): qty 1

## 2020-04-28 MED ORDER — NALBUPHINE HCL 10 MG/ML IJ SOLN
5.0000 mg | INTRAMUSCULAR | Status: DC | PRN
Start: 2020-04-28 — End: 2020-04-30
  Filled 2020-04-28: qty 1

## 2020-04-28 MED ORDER — NALBUPHINE HCL 10 MG/ML IJ SOLN
5.0000 mg | Freq: Once | INTRAMUSCULAR | Status: AC | PRN
Start: 2020-04-28 — End: 2020-04-28
  Administered 2020-04-28: 5 mg via INTRAVENOUS

## 2020-04-28 MED ORDER — SIMETHICONE 80 MG PO CHEW
80.0000 mg | CHEWABLE_TABLET | ORAL | Status: DC | PRN
  Administered 2020-04-30: 80 mg via ORAL

## 2020-04-28 MED ORDER — SOD CITRATE-CITRIC ACID 500-334 MG/5ML PO SOLN
30.0000 mL | ORAL | Status: AC
  Administered 2020-04-28: 30 mL via ORAL

## 2020-04-28 MED ORDER — HYDROMORPHONE HCL 1 MG/ML IJ SOLN: 0.2500 mg | INTRAMUSCULAR | Status: DC | PRN

## 2020-04-28 MED ORDER — KETOROLAC TROMETHAMINE 30 MG/ML IJ SOLN
INTRAMUSCULAR | Status: AC
  Filled 2020-04-28: qty 1

## 2020-04-28 SURGICAL SUPPLY — 42 items
APL SKNCLS STERI-STRIP NONHPOA (GAUZE/BANDAGES/DRESSINGS) ×1
BARRIER ADHS 3X4 INTERCEED (GAUZE/BANDAGES/DRESSINGS) ×1 IMPLANT
BENZOIN TINCTURE PRP APPL 2/3 (GAUZE/BANDAGES/DRESSINGS) ×2 IMPLANT
BRR ADH 4X3 ABS CNTRL BYND (GAUZE/BANDAGES/DRESSINGS) ×1
CHLORAPREP W/TINT 26ML (MISCELLANEOUS) ×2 IMPLANT
CLAMP CORD UMBIL (MISCELLANEOUS) IMPLANT
CLOTH BEACON ORANGE TIMEOUT ST (SAFETY) ×2 IMPLANT
DRSG OPSITE POSTOP 4X10 (GAUZE/BANDAGES/DRESSINGS) ×2 IMPLANT
ELECT REM PT RETURN 9FT ADLT (ELECTROSURGICAL) ×2
ELECTRODE REM PT RTRN 9FT ADLT (ELECTROSURGICAL) ×1 IMPLANT
EXTRACTOR VACUUM KIWI (MISCELLANEOUS) IMPLANT
GLOVE BIOGEL M 7.0 STRL (GLOVE) ×4 IMPLANT
GLOVE BIOGEL PI IND STRL 7.0 (GLOVE) ×3 IMPLANT
GLOVE BIOGEL PI INDICATOR 7.0 (GLOVE) ×3
GOWN STRL REUS W/TWL LRG LVL3 (GOWN DISPOSABLE) ×6 IMPLANT
HOVERMATT SINGLE USE (MISCELLANEOUS) ×1 IMPLANT
KIT ABG SYR 3ML LUER SLIP (SYRINGE) IMPLANT
LIGASURE IMPACT 36 18CM CVD LR (INSTRUMENTS) ×1 IMPLANT
NDL HYPO 25X5/8 SAFETYGLIDE (NEEDLE) IMPLANT
NEEDLE HYPO 25X5/8 SAFETYGLIDE (NEEDLE) IMPLANT
NS IRRIG 1000ML POUR BTL (IV SOLUTION) ×2 IMPLANT
PACK C SECTION WH (CUSTOM PROCEDURE TRAY) ×2 IMPLANT
PAD ABD 7.5X8 STRL (GAUZE/BANDAGES/DRESSINGS) ×1 IMPLANT
PAD OB MATERNITY 4.3X12.25 (PERSONAL CARE ITEMS) ×2 IMPLANT
PENCIL SMOKE EVAC W/HOLSTER (ELECTROSURGICAL) ×2 IMPLANT
RETRACTOR TRAXI PANNICULUS (MISCELLANEOUS) IMPLANT
RTRCTR C-SECT PINK 25CM LRG (MISCELLANEOUS) IMPLANT
SPONGE GAUZE 4X4 12PLY STER LF (GAUZE/BANDAGES/DRESSINGS) ×2 IMPLANT
STRIP CLOSURE SKIN 1/2X4 (GAUZE/BANDAGES/DRESSINGS) ×2 IMPLANT
SUT MNCRL 0 VIOLET CTX 36 (SUTURE) ×2 IMPLANT
SUT MONOCRYL 0 CTX 36 (SUTURE) ×4
SUT PDS AB 0 CT1 27 (SUTURE) ×4 IMPLANT
SUT PLAIN 0 NONE (SUTURE) IMPLANT
SUT VIC AB 2-0 CT1 27 (SUTURE) ×4
SUT VIC AB 2-0 CT1 TAPERPNT 27 (SUTURE) ×1 IMPLANT
SUT VIC AB 3-0 SH 27 (SUTURE)
SUT VIC AB 3-0 SH 27X BRD (SUTURE) IMPLANT
SUT VIC AB 4-0 KS 27 (SUTURE) ×2 IMPLANT
TOWEL OR 17X24 6PK STRL BLUE (TOWEL DISPOSABLE) ×2 IMPLANT
TRAXI PANNICULUS RETRACTOR (MISCELLANEOUS) ×1
TRAY FOLEY W/BAG SLVR 14FR LF (SET/KITS/TRAYS/PACK) ×2 IMPLANT
WATER STERILE IRR 1000ML POUR (IV SOLUTION) ×2 IMPLANT

## 2020-04-28 NOTE — Anesthesia Procedure Notes (Signed)
Spinal  Patient location during procedure: OB Start time: 04/28/2020 12:35 PM End time: 04/28/2020 12:42 PM Reason for block: surgical anesthesia Staffing Performed: anesthesiologist  Anesthesiologist: Trevor Iha, MD Preanesthetic Checklist Completed: patient identified, IV checked, risks and benefits discussed, surgical consent, monitors and equipment checked, pre-op evaluation and timeout performed Spinal Block Patient position: sitting Prep: DuraPrep and site prepped and draped Patient monitoring: heart rate, cardiac monitor, continuous pulse ox and blood pressure Approach: midline Location: L3-4 Injection technique: single-shot Needle Needle type: Pencan and Sprotte  Needle gauge: 27 G Needle length: 10 cm Needle insertion depth: 7 cm Assessment Sensory level: T4 Events: CSF return Additional Notes 1 Attempt (s). Pt tolerated procedure well.

## 2020-04-28 NOTE — Transfer of Care (Signed)
Immediate Anesthesia Transfer of Care Note  Patient: [name] Rosales  Procedure(s) Performed: CESAREAN SECTION WITH BILATERAL TUBAL LIGATION. Requesting [facility] to assist. (N/A )  Patient Location: PACU  Anesthesia Type:Spinal  Level of Consciousness: awake, alert  and oriented  Airway & Oxygen Therapy: Patient Spontanous Breathing  Post-op Assessment: Report given to RN and Post -op Vital signs reviewed and stable  Post vital signs: Reviewed and stable  Last Vitals:  Vitals Value Taken Time  BP 105/71 04/28/20 1412  Temp    Pulse 74 04/28/20 1414  Resp 23 04/28/20 1414  SpO2 100 % 04/28/20 1414  Vitals shown include unvalidated device data.  Last Pain:  Vitals:   04/28/20 1035  TempSrc: Oral         Complications: No complications documented.

## 2020-04-28 NOTE — Anesthesia Postprocedure Evaluation (Signed)
Anesthesia Post Note  Patient: [name] Rosales  Procedure(s) Performed: CESAREAN SECTION WITH BILATERAL TUBAL LIGATION. Requesting [facility] to assist. (N/A )     Patient location during evaluation: Mother Baby Anesthesia Type: Spinal Level of consciousness: oriented and awake and alert Pain management: pain level controlled Vital Signs Assessment: post-procedure vital signs reviewed and stable Respiratory status: spontaneous breathing and respiratory function stable Cardiovascular status: blood pressure returned to baseline and stable Postop Assessment: no headache, no backache, no apparent nausea or vomiting and able to ambulate Anesthetic complications: no   No complications documented.  Last Vitals:  Vitals:   04/28/20 1515 04/28/20 1531  BP: 106/71 113/66  Pulse: 73 72  Resp: 20 18  Temp: (!) 36.4 C (!) 36.4 C  SpO2: 99% 100%    Last Pain:  Vitals:   04/28/20 1531  TempSrc: Oral  PainSc: 4    Pain Goal:                   Trevor Iha

## 2020-04-28 NOTE — H&P (Signed)
[name] Rosales is a 30 y.o. female G4P1021 at 39 weeks and 0 days  presenting for repeat cesarean section and bilateral salpingectomy . Pregnancy has been uncomplicated. Prenatal care provided by Dr. Gerald Leitz with [facility]. . OB History    Gravida  4   Para  1   Term  1   Preterm      AB  2   Living  1     SAB  2   IAB      Ectopic      Multiple  0   Live Births  1          Past Medical History:  Diagnosis Date  . Family history of adverse reaction to anesthesia    pt's mother has hx. of being hard to wake up  . 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 Sensitive skin    Past Surgical History:  Procedure Laterality Date  . BREAST REDUCTION SURGERY Bilateral 06/01/2016   Procedure: BILATERAL MAMMARY REDUCTION  (BREAST);  Surgeon: Contogiannis, Chales Abrahams, MD;  Location: [facility];  Service: Plastics;  Laterality: Bilateral;  . CESAREAN SECTION N/A 04/27/2014   Procedure: CESAREAN SECTION;  Surgeon: Carrington Clamp, MD;  Location: [facility];  Service: Obstetrics;  Laterality: N/A;  . CYSTOSCOPY     ~ age 60  . DILATION AND EVACUATION N/A 11/20/2012   Procedure: DILATATION AND EVACUATION WITH ULTRASOUND BEFORE SURGERY;  Surgeon: Freddrick March. Tenny Craw, MD;  Location: [facility];  Service: Gynecology;  Laterality: N/A;  . DILATION AND EVACUATION N/A 07/13/2013   Procedure: DILATATION AND EVACUATION;  Surgeon: Loney Laurence, MD;  Location: [facility];  Service: Gynecology;  Laterality: N/A;  . WISDOM TOOTH EXTRACTION    . WRIST GANGLION EXCISION Left    Family History: family history includes Anesthesia problems in her mother; Hypertension in her father; Hypothyroidism in her mother. Social History:  reports that she has never smoked. She has never used smokeless tobacco. She reports current alcohol use. She reports that she does not use drugs.     Maternal Diabetes: No Genetic Screening: Normal Maternal Ultrasounds/Referrals: Normal Fetal Ultrasounds or other Referrals:   None Maternal Substance Abuse:  No Significant Maternal Medications:  None Significant Maternal Lab Results:  None Other Comments:  None  Review of Systems  Constitutional: Negative.   HENT: Negative.   Eyes: Negative.   Respiratory: Negative.   Cardiovascular: Negative.   Gastrointestinal: Negative.   Endocrine: Negative.   Genitourinary: Negative.   Musculoskeletal: Negative.   Skin: Negative.   Allergic/Immunologic: Negative.   Neurological: Negative.   Hematological: Negative.   Psychiatric/Behavioral: Negative.    History   Blood pressure 128/62, pulse 93, temperature 98.5 F (36.9 C), temperature source Oral, resp. rate 18, height 5\' 4"  (1.626 m), weight 132.5 kg. Maternal Exam:  Introitus: Normal vulva.   Physical Exam Vitals reviewed.  Constitutional:      Appearance: Normal appearance.  HENT:     Head: Normocephalic and atraumatic.     Nose: Nose normal.  Eyes:     Pupils: Pupils are equal, round, and reactive to light.  Cardiovascular:     Rate and Rhythm: Normal rate and regular rhythm.     Pulses: Normal pulses.     Heart sounds: Normal heart sounds.  Pulmonary:     Effort: Pulmonary effort is normal.     Breath sounds: Normal breath sounds.  Abdominal:     Tenderness: There is no abdominal tenderness.  Genitourinary:  General: Normal vulva.  Musculoskeletal:        General: Swelling present. Normal range of motion.     Cervical back: Normal range of motion and neck supple.  Skin:    General: Skin is warm and dry.  Neurological:     General: No focal deficit present.     Mental Status: She is alert and oriented to person, place, and time.  Psychiatric:        Mood and Affect: Mood normal.        Behavior: Behavior normal.     Prenatal labs: ABO, Rh: --/--/A POS (04/08 1045) Antibody: NEG (04/08 1045) Rubella: Immune (10/06 0000) RPR: NON REACTIVE (04/08 1025)  HBsAg: Negative (10/06 0000)  HIV: Non-reactive (10/06 0000)  GBS:  pending    Assessment/Plan: 39 weeks and  0 days with h/o cesarean section . Pt desires repeat cesarean section and declines trial of labor. She also desires permanent sterilization via bilateral salpingectomy.  Risk benefits and alternatives of cesarean section were discussed with the patient including but not limited to infection, bleeding, damage to bowel , bladder and baby with the need for further surgery. Risk of transfusion HIV/ Hep B&C. Pt voiced understanding and desires to proceed.   RO bilateral salpingectomy discussed. Including that this is considered permanent form of sterilization. Pt voiced understanding and desires to proceed.   Gerald Leitz 04/28/2020, 12:15 PM

## 2020-04-28 NOTE — H&P (Signed)
Date of Initial H&P:04/28/2020 History reviewed, patient examined, no change in status, stable for surgery.

## 2020-04-28 NOTE — Op Note (Signed)
Cesarean Section and bilateral salpingectomy  Procedure Note  Indications:  h/o cesarean section pt desires repeat and permanent sterilization   Pre-operative Diagnosis: 39 week 0 day pregnancy.  Post-operative Diagnosis: same  Surgeon: Gerald Leitz   Assistants: Dr. Steva Ready assisted due to concern for adhesive disease and complexity of the anatomy   Anesthesia: Spinal anesthesia  ASA Class: 2   Procedure Details   The patient was seen in the Holding Room. The risks, benefits, complications, treatment options, and expected outcomes were discussed with the patient.  The patient concurred with the proposed plan, giving informed consent.  The site of surgery properly noted/marked. The patient was taken to Operating Room # B, identified as Kris Burd Arenivas and the procedure verified as C-Section Delivery. A Time Out was held and the above information confirmed.  After induction of anesthesia, the patient was draped and prepped in the usual sterile manner. A Pfannenstiel incision was made and carried down through the subcutaneous tissue to the fascia. Fascial incision was made and extended transversely. The fascia was separated from the underlying rectus tissue superiorly and inferiorly. The peritoneum was identified and entered. Peritoneal incision was extended longitudinally. The utero-vesical peritoneal reflection was incised transversely and the bladder flap was bluntly freed from the lower uterine segment. A low transverse uterine incision was made. Delivered from cephalic presentation was a 3420 gram Female with Apgar scores of 9 at one minute and 9 at five minutes. After the umbilical cord was clamped and cut cord blood was obtained for evaluation. The placenta was removed intact and appeared normal. The uterine outline, tubes and ovaries appeared normal. The uterine incision was closed with running locked sutures of 0 monocryl. . Hemostasis was observed. The left fallopian tube was  grasped with the babcock clamp. The ligasure was used to cauterize and excise the fallopian tube along the mesosalpinx. This was repeated on the Right fallopian tube.  Lavage was carried out until clear. The fascia was then reapproximated with running sutures of 0 pds. The skin was reapproximated with 4.0 vicryl .  Instrument, sponge, and needle counts were correct prior the abdominal closure and at the conclusion of the case.   Findings: Female fetus in the cephalic presentation. Clear amniotic fluid. Normal appearing fallopian tubes and ovaries   Estimated Blood Loss:  300 mL         Drains: None         Total IV Fluids:  Per Anesthesia ml         Specimens: Fallopian Tube and Disposition:  Sent to Pathology.. Placenta sent to labor and delivery           Implants: None         Complications:  None; patient tolerated the procedure well.         Disposition: PACU - hemodynamically stable.         Condition: stable  Attending Attestation: I performed the procedure.

## 2020-04-29 LAB — CBC
HCT: 31.2 % — ABNORMAL LOW (ref 36.0–46.0)
Hemoglobin: 9.9 g/dL — ABNORMAL LOW (ref 12.0–15.0)
MCH: 27.3 pg (ref 26.0–34.0)
MCHC: 31.7 g/dL (ref 30.0–36.0)
MCV: 86.2 fL (ref 80.0–100.0)
Platelets: 185 10*3/uL (ref 150–400)
RBC: 3.62 MIL/uL — ABNORMAL LOW (ref 3.87–5.11)
RDW: 16.6 % — ABNORMAL HIGH (ref 11.5–15.5)
WBC: 6.8 10*3/uL (ref 4.0–10.5)
nRBC: 0 % (ref 0.0–0.2)

## 2020-04-29 LAB — BIRTH TISSUE RECOVERY COLLECTION (PLACENTA DONATION)

## 2020-04-29 NOTE — Progress Notes (Signed)
Subjective: Postpartum Day 1: Cesarean Delivery Patient reports incisional pain and tolerating PO.    Objective: Vital signs in last 24 hours: Temp:  [98.6 F (37 C)-99.1 F (37.3 C)] 98.9 F (37.2 C) (04/12 0600) Pulse Rate:  [68-75] 75 (04/12 1431) Resp:  [14-19] 19 (04/12 1431) BP: (98-115)/(45-68) 115/51 (04/12 1431) SpO2:  [98 %-99 %] 98 % (04/12 1431)  Physical Exam:  General: alert, cooperative and no distress Lochia: appropriate Uterine Fundus: firm Incision: bandage clean dry and intact  DVT Evaluation: No evidence of DVT seen on physical exam.  Recent Labs    04/29/20 0504  HGB 9.9*  HCT 31.2*    Assessment/Plan: Status post Cesarean section. Doing well postoperatively.  Continue current care  Morbid obesity - lovenox to start today for DVT prophylaxis  Pt may shower this afternoon - she is instructed to remove the pressure dressing in the shower  Discharge probably tomorrow if baby is doing well.  Gerald Leitz 04/29/2020, 6:01 PM

## 2020-04-30 LAB — SURGICAL PATHOLOGY

## 2020-04-30 MED ORDER — HYDROMORPHONE HCL 2 MG PO TABS: 2.0000 mg | ORAL_TABLET | ORAL | 0 refills | Status: AC | PRN

## 2020-04-30 MED ORDER — IBUPROFEN 800 MG PO TABS: 800.0000 mg | ORAL_TABLET | Freq: Three times a day (TID) | ORAL | 1 refills | Status: DC | PRN

## 2020-04-30 MED ORDER — ACETAMINOPHEN 500 MG PO TABS
1000.0000 mg | ORAL_TABLET | Freq: Three times a day (TID) | ORAL | 0 refills | Status: DC | PRN
Start: 2020-04-30 — End: 2022-04-14

## 2020-04-30 NOTE — Discharge Summary (Signed)
Postpartum Discharge Summary  Date of Service updated 04/30/2020     Patient Name: [name] Rosales DOB: 1990/03/11 MRN: [id number]  Date of admission: 04/28/2020 Delivery date:04/28/2020  Delivering provider: Christophe Louis  Date of discharge: 04/30/2020  Admitting diagnosis: History of cesarean delivery [Z98.891] Intrauterine pregnancy: [redacted]w[redacted]d     Secondary diagnosis:  Active Problems:   History of cesarean delivery  Additional problems: None    Discharge diagnosis: Term Pregnancy Delivered                                              Post partum procedures:None Augmentation: N/A Complications: None  Hospital course: Sceduled C/S   30 y.o. yo [id number] at [redacted]w[redacted]d was admitted to the hospital 04/28/2020 for scheduled cesarean section with the following indication:Elective Repeat.Delivery details are as follows:  Membrane Rupture Time/Date: 1:14 PM ,04/28/2020   Delivery Method:C-Section, Low Transverse  Details of operation can be found in separate operative note.  Patient had an uncomplicated postpartum course.  She is ambulating, tolerating a regular diet, passing flatus, and urinating well. Patient is discharged home in stable condition on  04/30/20        Newborn Data: Birth date:04/28/2020  Birth time:1:14 PM  Gender:Female  Living status:Living  Apgars:9 ,9  Weight:3420 g     Magnesium Sulfate received: No BMZ received: No Rhophylac:N/A MMR:N/A T-DaP:Given prenatally Flu: N/A Transfusion:No  Physical exam  Vitals:   04/28/20 2315 04/29/20 0600 04/29/20 1431 04/30/20 0530  BP: 110/68 102/62 (!) 115/51 122/78  Pulse:  68 75 82  Resp: $Remo'16 14 19 18  'WNOes$ Temp: 99.1 F (37.3 C) 98.9 F (37.2 C)  98.4 F (36.9 C)  TempSrc: Axillary Axillary  Oral  SpO2:   98% 98%  Weight:      Height:       General: alert, cooperative and no distress Lochia: appropriate Uterine Fundus: firm Incision: Healing well with no significant drainage DVT Evaluation: Negative Homan's  sign. Labs: Lab Results  Component Value Date   WBC 6.8 04/29/2020   HGB 9.9 (L) 04/29/2020   HCT 31.2 (L) 04/29/2020   MCV 86.2 04/29/2020   PLT 185 04/29/2020   No flowsheet data found. Edinburgh Score: Edinburgh Postnatal Depression Scale Screening Tool 04/30/2020  I have been able to laugh and see the funny side of things. 0  I have looked forward with enjoyment to things. 0  I have blamed myself unnecessarily when things went wrong. 0  I have been anxious or worried for no good reason. 0  I have felt scared or panicky for no good reason. 0  Things have been getting on top of me. 0  I have been so unhappy that I have had difficulty sleeping. 0  I have felt sad or miserable. 0  I have been so unhappy that I have been crying. 0  The thought of harming myself has occurred to me. 0  Edinburgh Postnatal Depression Scale Total 0      After visit meds:  Allergies as of 04/30/2020      Reactions   Codeine Nausea Only   Other Itching   SOAP/LOTION WITH FRAGRANCE      Medication List    TAKE these medications   acetaminophen 500 MG tablet Commonly known as: TYLENOL Take 2 tablets (1,000 mg total) by mouth every 8 (eight) hours as  needed.   HYDROmorphone 2 MG tablet Commonly known as: DILAUDID Take 1 tablet (2 mg total) by mouth every 4 (four) hours as needed for up to 7 days for severe pain ((when tolerating fluids)).   ibuprofen 800 MG tablet Commonly known as: ADVIL Take 1 tablet (800 mg total) by mouth every 8 (eight) hours as needed.        Discharge home in stable condition Infant Feeding: Bottle Infant Disposition:home with mother Discharge instruction: per After Visit Summary and Postpartum booklet. Activity: Advance as tolerated. Pelvic rest for 6 weeks.  Diet: low salt diet Anticipated Birth Control: bilateral salpingectomy at the time of cesarean section  Postpartum Appointment:2 weeks Additional Postpartum F/U: Incision check 2 weeks  Future  Appointments:No future appointments. Follow up Visit:  Follow-up Information    Christophe Louis, MD. Go in 2 week(s).   Specialty: Obstetrics and Gynecology Why: please keep your appointment with Dr. Landry Mellow in 2 weeks for an incision check  Contact information: [address] [place] [address] [phone]                   04/30/2020 Christophe Louis, MD

## 2020-05-02 ENCOUNTER — Inpatient Hospital Stay (HOSPITAL_COMMUNITY)
Admission: AD | Admit: 2020-05-02 | Discharge: 2020-05-02 | Disposition: A | Discharge status: Home / Self Care | Payer: Medicaid Other | Attending: Obstetrics & Gynecology | Admitting: Obstetrics & Gynecology

## 2020-05-02 ENCOUNTER — Inpatient Hospital Stay (HOSPITAL_BASED_OUTPATIENT_CLINIC_OR_DEPARTMENT_OTHER): Payer: Medicaid Other

## 2020-05-02 ENCOUNTER — Other Ambulatory Visit: Payer: Self-pay

## 2020-05-02 ENCOUNTER — Encounter (HOSPITAL_COMMUNITY): Payer: Self-pay | Admitting: Obstetrics & Gynecology

## 2020-05-02 DIAGNOSIS — O9089 Other complications of the puerperium, not elsewhere classified: Secondary | ICD-10-CM | POA: Insufficient documentation

## 2020-05-02 DIAGNOSIS — R600 Localized edema: Secondary | ICD-10-CM

## 2020-05-02 DIAGNOSIS — M7989 Other specified soft tissue disorders: Secondary | ICD-10-CM | POA: Diagnosis not present

## 2020-05-02 DIAGNOSIS — O99893 Other specified diseases and conditions complicating puerperium: Secondary | ICD-10-CM

## 2020-05-02 DIAGNOSIS — Z98891 History of uterine scar from previous surgery: Secondary | ICD-10-CM

## 2020-05-02 NOTE — MAU Provider Note (Signed)
History    CSN: [id number]  Arrival date and time: 05/02/20 1211  Event Date/Time  First Provider Initiated Contact with Patient 05/02/20 1409     Chief Complaint  Patient presents with  . Leg Pain   HPI [name] Rosales is a 30 y.o. [id number] post-op patient who presents to MAU with chief complaint of unilateral swelling in her left lower extremity. This is a new problem, onset last night 05/01/2020 at 2130. She is s/p repeat cesarean on 04/28/2020 with discharge home on 04/30/2020. She reports that her left leg is much larger from calf down to her foot than her right. She denies SOB, weakness, calf pain, calf discoloration, syncope. She has not fallen or otherwise injured her leg. She is bottle feeding and receives care with [facility].   OB History    Gravida  4   Para  2   Term  2   Preterm      AB  2   Living  2     SAB  2   IAB      Ectopic      Multiple  0   Live Births  2           Past Medical History:  Diagnosis Date  . Family history of adverse reaction to anesthesia    pt's mother has hx. of being hard to wake up  . 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 Sensitive skin     Past Surgical History:  Procedure Laterality Date  . BREAST REDUCTION SURGERY Bilateral 06/01/2016   Procedure: BILATERAL MAMMARY REDUCTION  (BREAST);  Surgeon: Contogiannis, Chales AbrahamsMary Ann, MD;  [facility];  Service: Plastics;  Laterality: Bilateral;  . CESAREAN SECTION N/A 04/27/2014   Procedure: CESAREAN SECTION;  Surgeon: Carrington ClampMichelle Horvath, MD;  Location: [facility];  Service: Obstetrics;  Laterality: N/A;  . CESAREAN SECTION WITH BILATERAL TUBAL LIGATION N/A 04/28/2020   Procedure: CESAREAN SECTION WITH BILATERAL TUBAL LIGATION. Requesting [facility] to assist.;  Surgeon: Gerald Leitzole, Tara, MD;  [facility];  Service: Obstetrics;  Laterality: N/A;  . CYSTOSCOPY     ~ age 37  . DILATION AND EVACUATION N/A 11/20/2012   Procedure: DILATATION AND EVACUATION WITH ULTRASOUND BEFORE  SURGERY;  Surgeon: Freddrick MarchKendra H. Tenny Crawoss, MD;  Location: [facility];  Service: Gynecology;  Laterality: N/A;  . DILATION AND EVACUATION N/A 07/13/2013   Procedure: DILATATION AND EVACUATION;  Surgeon: Loney LaurenceMichelle A Horvath, MD;  Location: [facility];  Service: Gynecology;  Laterality: N/A;  . WISDOM TOOTH EXTRACTION    . WRIST GANGLION EXCISION Left     Family History  Problem Relation Age of Onset  . Hypothyroidism Mother   . Anesthesia problems Mother        hard to wake up post-op  . Hypertension Father     Social History   Tobacco Use  . Smoking status: Never Smoker  . Smokeless tobacco: Never Used  Vaping Use  . Vaping Use: Never used  Substance Use Topics  . Alcohol use: Yes    Comment: rare occasions  . Drug use: No    Allergies:  Allergies  Allergen Reactions  . Codeine Nausea Only  . Other Itching    SOAP/LOTION WITH FRAGRANCE    Medications Prior to Admission  Medication Sig Dispense Refill Last Dose  . acetaminophen (TYLENOL) 500 MG tablet Take 2 tablets (1,000 mg total) by mouth every 8 (eight) hours as needed. 30 tablet 0 05/01/2020 at Unknown time  .  ibuprofen (ADVIL) 800 MG tablet Take 1 tablet (800 mg total) by mouth every 8 (eight) hours as needed. 30 tablet 1 05/01/2020 at Unknown time  . HYDROmorphone (DILAUDID) 2 MG tablet Take 1 tablet (2 mg total) by mouth every 4 (four) hours as needed for up to 7 days for severe pain ((when tolerating fluids)). 12 tablet 0     Review of Systems  Cardiovascular: Positive for leg swelling.  All other systems reviewed and are negative.  Physical Exam   Blood pressure 116/72, pulse 75, temperature 99.1 F (37.3 C), temperature source Oral, resp. rate 18, height 5\' 4"  (1.626 m), weight 131.6 kg, SpO2 100 %, unknown if currently breastfeeding.  Physical Exam Vitals and nursing note reviewed.  Constitutional:      General: She is not in acute distress.    Appearance: Normal appearance. She is obese. She is not ill-appearing.   Cardiovascular:     Rate and Rhythm: Normal rate and regular rhythm.     Pulses: Normal pulses.     Heart sounds: Normal heart sounds.     Comments: Edema appropriate for post op cesarean. Negligible visual difference in degree of swelling Pulmonary:     Effort: Pulmonary effort is normal.     Breath sounds: Normal breath sounds.  Abdominal:     Tenderness: There is no right CVA tenderness or left CVA tenderness.  Musculoskeletal:     Right lower leg: 2+ Edema present.     Left lower leg: 1+ Edema present.  Skin:    Capillary Refill: Capillary refill takes less than 2 seconds.     Comments: Honeycomb dressing with small amount of old dried drainage, no bruising, streaking, non-tender  Neurological:     Mental Status: She is alert and oriented to person, place, and time.  Psychiatric:        Mood and Affect: Mood normal.        Behavior: Behavior normal.        Thought Content: Thought content normal.        Judgment: Judgment normal.     MAU Course  Procedures  Patient Vitals for the past 24 hrs:  BP Temp Temp src Pulse Resp SpO2 Height Weight  05/02/20 1426 126/79 -- -- 78 -- -- -- --  05/02/20 1317 116/72 -- -- 75 -- -- -- --  05/02/20 1303 138/80 99.1 F (37.3 C) Oral 86 18 100 % 5\' 4"  (1.626 m) 131.6 kg   VAS 05/04/20 LOWER EXTREMITY VENOUS (DVT) (ONLY [facility])  Result Date: 05/02/2020  Lower Venous DVT Study Indications: Swelling.  Comparison Study: no prior Performing Technologist: [place] RVS  Examination Guidelines: A complete evaluation includes B-mode imaging, spectral Doppler, color Doppler, and power Doppler as needed of all accessible portions of each vessel. Bilateral testing is considered an integral part of a complete examination. Limited examinations for reoccurring indications may be performed as noted. The reflux portion of the exam is performed with the patient in reverse Trendelenburg.  +-----+---------------+---------+-----------+----------+--------------+  RIGHTCompressibilityPhasicitySpontaneityPropertiesThrombus Aging +-----+---------------+---------+-----------+----------+--------------+ CFV  Full           Yes      Yes                                 +-----+---------------+---------+-----------+----------+--------------+   +---------+---------------+---------+-----------+----------+--------------+ LEFT     CompressibilityPhasicitySpontaneityPropertiesThrombus Aging +---------+---------------+---------+-----------+----------+--------------+ CFV      Full  Yes      Yes                                 +---------+---------------+---------+-----------+----------+--------------+ SFJ      Full                                                        +---------+---------------+---------+-----------+----------+--------------+ FV Prox  Full                                                        +---------+---------------+---------+-----------+----------+--------------+ FV Mid   Full                                                        +---------+---------------+---------+-----------+----------+--------------+ FV DistalFull                                                        +---------+---------------+---------+-----------+----------+--------------+ PFV      Full                                                        +---------+---------------+---------+-----------+----------+--------------+ POP      Full           Yes      Yes                                 +---------+---------------+---------+-----------+----------+--------------+ PTV      Full                                                        +---------+---------------+---------+-----------+----------+--------------+ PERO     Full                                                        +---------+---------------+---------+-----------+----------+--------------+     Summary: RIGHT: - No evidence of common femoral vein  obstruction.  LEFT: - There is no evidence of deep vein thrombosis in the lower extremity.  - No cystic structure found in the popliteal fossa.  *See table(s) above for measurements and observations. Electronically signed by Lemar Livings MD on 05/02/2020 at 2:26:32 PM.    Final    Assessment and Plan  --30 y.o. G4P2 --  S/p elective repeat cesarean on 04/30/20 --Venous doppler negative for DVT --Advised compression socks, hydration, elevate legs to hip level at home when able --Increase PO hydration with water --Abdominal incision without complications --Discharge home in stable condition  F/U: --Incision check with [facility] on 05/12/2020  Calvert Cantor, CNM 05/02/2020, 6:25 PM

## 2020-05-02 NOTE — MAU Note (Signed)
Pt reports to MAU complaining of left leg pain that started 4/14 around 2130. She states her foot and whole leg was swollen this morning around 0730. When asked to rate the pain she states "it is really just more uncomfortable than painful." Pt states she is 5 days postpartum.

## 2020-05-02 NOTE — Progress Notes (Signed)
Lower extremity venous has been completed.   Preliminary results in CV Proc.   Blanch Media 05/02/2020 2:03 PM

## 2020-05-02 NOTE — MAU Note (Signed)
Vascular ultrasound in progress

## 2020-05-02 NOTE — Discharge Instructions (Signed)

## 2020-06-03 ENCOUNTER — Inpatient Hospital Stay (HOSPITAL_COMMUNITY)
Admission: AD | Admit: 2020-06-03 | Discharge: 2020-06-03 | Disposition: A | Discharge status: Home / Self Care | Payer: Medicaid Other | Attending: Obstetrics and Gynecology | Admitting: Obstetrics and Gynecology

## 2020-06-03 ENCOUNTER — Other Ambulatory Visit: Payer: Self-pay

## 2020-06-03 ENCOUNTER — Inpatient Hospital Stay (HOSPITAL_COMMUNITY): Payer: Medicaid Other

## 2020-06-03 ENCOUNTER — Encounter (HOSPITAL_COMMUNITY): Payer: Self-pay | Admitting: Obstetrics and Gynecology

## 2020-06-03 DIAGNOSIS — N939 Abnormal uterine and vaginal bleeding, unspecified: Secondary | ICD-10-CM

## 2020-06-03 LAB — CBC
HCT: 41.8 % (ref 36.0–46.0)
Hemoglobin: 12.7 g/dL (ref 12.0–15.0)
MCH: 26 pg (ref 26.0–34.0)
MCHC: 30.4 g/dL (ref 30.0–36.0)
MCV: 85.7 fL (ref 80.0–100.0)
Platelets: 243 10*3/uL (ref 150–400)
RBC: 4.88 MIL/uL (ref 3.87–5.11)
RDW: 16 % — ABNORMAL HIGH (ref 11.5–15.5)
WBC: 6.6 10*3/uL (ref 4.0–10.5)
nRBC: 0 % (ref 0.0–0.2)

## 2020-06-03 NOTE — MAU Provider Note (Signed)
History     CSN: [id number]  Arrival date and time: 06/03/20 1024   Event Date/Time   First Provider Initiated Contact with Patient 06/03/20 1104      Chief Complaint  Patient presents with  . Vaginal Bleeding   [name] is a 30 y.o. [id number] at ppartum who presents to MAU for vaginal bleeding. Patient reports she had a normal postpartum course and the bleeding stopped as expected, but today it returned with a gush of bleeding and reports the bleeding is more than her usual periods. Patient is not breastfeeding. Patient had C/S on 04/28/2020 and had a BTL at that time.  Pt denies vaginal discharge/odor/itching. Pt denies N/V, abdominal pain, constipation, diarrhea, or urinary problems. Pt denies fever, chills, fatigue, sweating or changes in appetite. Pt denies SOB or chest pain. Pt denies dizziness, HA, light-headedness, weakness.   OB History    Gravida  4   Para  2   Term  2   Preterm      AB  2   Living  2     SAB  2   IAB      Ectopic      Multiple  0   Live Births  2           Past Medical History:  Diagnosis Date  . Family history of adverse reaction to anesthesia    pt's mother has hx. of being hard to wake up  . 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 Sensitive skin     Past Surgical History:  Procedure Laterality Date  . BREAST REDUCTION SURGERY Bilateral 06/01/2016   Procedure: BILATERAL MAMMARY REDUCTION  (BREAST);  Surgeon: Contogiannis, Chales Abrahams, MD;  Location:  [facility];  Service: Plastics;  Laterality: Bilateral;  . CESAREAN SECTION N/A 04/27/2014   Procedure: CESAREAN SECTION;  Surgeon: Carrington Clamp, MD;  Location: [facility];  Service: Obstetrics;  Laterality: N/A;  . CESAREAN SECTION WITH BILATERAL TUBAL LIGATION N/A 04/28/2020   Procedure: CESAREAN SECTION WITH BILATERAL TUBAL LIGATION. Requesting [facility] to assist.;  Surgeon: Gerald Leitz, MD;  Location: [facility] LD ORS;  Service: Obstetrics;  Laterality: N/A;  . CYSTOSCOPY     ~  age 77  . DILATION AND EVACUATION N/A 11/20/2012   Procedure: DILATATION AND EVACUATION WITH ULTRASOUND BEFORE SURGERY;  Surgeon: Freddrick March. Tenny Craw, MD;  Location: [facility];  Service: Gynecology;  Laterality: N/A;  . DILATION AND EVACUATION N/A 07/13/2013   Procedure: DILATATION AND EVACUATION;  Surgeon: Loney Laurence, MD;  Location: [facility];  Service: Gynecology;  Laterality: N/A;  . WISDOM TOOTH EXTRACTION    . WRIST GANGLION EXCISION Left     Family History  Problem Relation Age of Onset  . Hypothyroidism Mother   . Anesthesia problems Mother        hard to wake up post-op  . Hypertension Father     Social History   Tobacco Use  . Smoking status: Never Smoker  . Smokeless tobacco: Never Used  Vaping Use  . Vaping Use: Never used  Substance Use Topics  . Alcohol use: Yes    Comment: rare occasions  . Drug use: No    Allergies:  Allergies  Allergen Reactions  . Codeine Nausea Only  . Other Itching    SOAP/LOTION WITH FRAGRANCE    No medications prior to admission.    Review of Systems  Constitutional: Negative for chills, diaphoresis, fatigue and fever.  Eyes: Negative for visual disturbance.  Respiratory:  Negative for shortness of breath.   Cardiovascular: Negative for chest pain.  Gastrointestinal: Negative for abdominal pain, constipation, diarrhea, nausea and vomiting.  Genitourinary: Positive for vaginal bleeding. Negative for dysuria, flank pain, frequency, pelvic pain, urgency and vaginal discharge.  Neurological: Negative for dizziness, weakness, light-headedness and headaches.   Physical Exam   Blood pressure 121/73, pulse 71, temperature 98 F (36.7 C), temperature source Oral, resp. rate 18, height 5\' 4"  (1.626 m), weight 124.6 kg, SpO2 99 %, unknown if currently breastfeeding.  Patient Vitals for the past 24 hrs:  BP Temp Temp src Pulse Resp SpO2 Height Weight  06/03/20 1051 121/73 -- -- 71 -- -- -- --  06/03/20 1042 130/84 98 F (36.7 C) Oral 82 18  99 % -- --  06/03/20 1036 -- -- -- -- -- -- 5\' 4"  (1.626 m) 124.6 kg   Physical Exam Vitals and nursing note reviewed. Exam conducted with a chaperone present.  Constitutional:      General: She is not in acute distress.    Appearance: Normal appearance. She is not ill-appearing, toxic-appearing or diaphoretic.  HENT:     Head: Normocephalic and atraumatic.  Pulmonary:     Effort: Pulmonary effort is normal.  Genitourinary:    General: Normal vulva.     Labia:        Right: No rash, tenderness, lesion or injury.        Left: No rash, tenderness, lesion or injury.      Vagina: Bleeding present.     Cervix: Normal.     Comments: Moderate amount of bright red, liquid blood with miniature clots pooled in vagina, almost no bleeding present on pad. Cleared with 6 swabs, bleeding noted to be coming from external os, very slow trickle. Skin:    General: Skin is warm and dry.  Neurological:     Mental Status: She is alert and oriented to person, place, and time.  Psychiatric:        Mood and Affect: Mood normal.        Behavior: Behavior normal.        Thought Content: Thought content normal.        Judgment: Judgment normal.    Results for orders placed or performed during the hospital encounter of 06/03/20 (from the past 24 hour(s))  CBC     Status: Abnormal   Collection Time: 06/03/20 11:58 AM  Result Value Ref Range   WBC 6.6 4.0 - 10.5 K/uL   RBC 4.88 3.87 - 5.11 MIL/uL   Hemoglobin 12.7 12.0 - 15.0 g/dL   HCT 06/05/20 06/05/20 - 16.1 %   MCV 85.7 80.0 - 100.0 fL   MCH 26.0 26.0 - 34.0 pg   MCHC 30.4 30.0 - 36.0 g/dL   RDW 09.6 (H) 04.5 - 40.9 %   Platelets 243 150 - 400 K/uL   nRBC 0.0 0.0 - 0.2 %   81.1 PELVIC COMPLETE WITH TRANSVAGINAL  Result Date: 06/03/2020 CLINICAL DATA:  Excessive postpartum bleeding, C-section 04/28/2020 EXAM: TRANSABDOMINAL AND TRANSVAGINAL ULTRASOUND OF PELVIS TECHNIQUE: Both transabdominal and transvaginal ultrasound examinations of the pelvis were  performed. Transabdominal technique was performed for global imaging of the pelvis including uterus, ovaries, adnexal regions, and pelvic cul-de-sac. It was necessary to proceed with endovaginal exam following the transabdominal exam to visualize the uterus, endometrium, ovaries, and adnexa. COMPARISON:  None FINDINGS: Uterus Measurements: 8.7 x 4.8 x 6.0 cm = volume: 134 mL. No fibroids or other mass visualized. C-section scar  of the anterior lower uterine segment. Endometrium Thickness: 14 mm.  No focal abnormality visualized. Right ovary Nonvisualized. Left ovary Measurements: 2.4 x 1.4 x 1.8 cm = volume: 3 mL. Normal appearance/no adnexal mass. Other findings Trace free fluid in the low pelvis. IMPRESSION: 1. C-section scar of the anterior lower uterine segment. 2. No ultrasound findings to explain postpartum bleeding. 3. The right ovary is nonvisualized.  Normal left ovary. 4. Trace free fluid in the low pelvis, likely physiologic. Electronically Signed   By: Lauralyn Primes M.D.   On: 06/03/2020 12:48   MAU Course  Procedures  MDM -VB at 5 weeks PP -moderate blood pooling in  -CBC: WNL -[place]: 1. C-section scar of the anterior lower uterine segment.          2. No ultrasound findings to explain postpartum bleeding.          3. The right ovary is nonvisualized.  Normal left ovary.          4. Trace free fluid in the low pelvis, likely physiologic. -discussed with Dr. Charlotta Newton, no treatment needed at this time, likely menses, can offer [facility] -pt has pp visit in one week, is s/p BTL, will discuss Mirena at that visit -pt discharged to home in stable condition  Orders Placed This Encounter  Procedures  . US PELVIC COMPLETE WITH TRANSVAGINAL    Standing Status:   Standing    Number of Occurrences:   1    Order Specific Question:   Symptom/Reason for Exam    Answer:   Excessive postpartum bleeding [[id number]]  . CBC    Standing Status:   Standing    Number of Occurrences:   1  . Discharge patient     Order Specific Question:   Discharge disposition    Answer:   01-Home or Self Care [1]    Order Specific Question:   Discharge patient date    Answer:   06/03/2020   No orders of the defined types were placed in this encounter.  Assessment and Plan   1. Vaginal bleeding        Allergies as of 06/03/2020      Reactions   Codeine Nausea Only   Other Itching   SOAP/LOTION WITH FRAGRANCE      Medication List    TAKE these medications   acetaminophen 500 MG tablet Commonly known as: TYLENOL Take 2 tablets (1,000 mg total) by mouth every 8 (eight) hours as needed.   ibuprofen 800 MG tablet Commonly known as: ADVIL Take 1 tablet (800 mg total) by mouth every 8 (eight) hours as needed.       -discussed with patient likely return of menses -advised can get Mirena for heavy menstrual bleeding -bleeding precautions given -pt discharged to home in stable condition  Joni Reining E Zaim Nitta 06/03/2020, 2:52 PM

## 2020-06-03 NOTE — Discharge Instructions (Signed)
Postpartum hemorrhage. Elsevier.">  Postpartum Hemorrhage Postpartum hemorrhage is losing too much blood after childbirth. Bleeding (lochia) after vaginal or cesarean delivery is normal and should be expected. Menstrual or period-like bleeding will occur for several days after childbirth. However, postpartum hemorrhage is very heavy bleeding over a shorter period of time. This is a serious condition. Postpartum hemorrhage usually occurs within the first 24 hours after delivery. However, delayed postpartum hemorrhage can happen after you leave the hospital. This is rare. What are the causes? This condition is caused by:  A loss of muscle tone in the uterus after childbirth.  Failure to deliver all of the placenta.  Trauma to the uterus, cervix, or vagina during delivery.  A disorder that prevents blood from clotting. This is rare. What increases the risk? This condition is likely to develop in women who:  Have a history of postpartum hemorrhage.  Have a long labor or need medicine to make contractions stronger.  Have a large baby (fetal macrosomia).  Have a hypertensive disorder of pregnancy. These include high blood pressure, preeclampsia, or eclampsia.  Have complications during labor, such as an infection or heavy bleeding.  Are obese.  Are pregnant with more than one baby (twins or more). What are the signs or symptoms? Symptoms of this condition include:  Passing large clots or pieces of tissue. These may be small pieces of placenta left after delivery.  Soaking more than one sanitary pad per hour.  Severe pain near the vagina or perineum (retroperitoneal hematoma).  A drop in blood pressure.  Light-headedness or fainting. How is this diagnosed? This condition may be diagnosed based on:  Your symptoms.  A physical exam of your perineum, vagina, cervix, and uterus.  Weighing of blood-soaked perineal pads or items to determine the amount of blood loss.  Tests,  including: ? Blood pressure and pulse measurements. ? Blood tests. ? An ultrasound. How is this treated? Treatment for this condition depends on the severity of your symptoms. It may include:  Massage of the uterus and manual removal of blood clots.  Medicines to help the uterus return to normal size.  Inserting a balloon that puts pressure on the walls of the uterus (uterine tamponade).  Blood transfusions and fluids given through an IV that is inserted into one of your veins.  A medical procedure to compress arteries that supply blood to the uterus. Sometimes bleeding occurs if portions of the placenta are left behind in the uterus after delivery. If this happens, a curettage or scraping of the inside of the uterus must be done. This usually stops the bleeding. If curettage does not stop the bleeding, surgery may be done to remove the uterus (hysterectomy). This surgery is rare. Other treatments may be needed if bleeding is caused by clotting or bleeding problems that are not related to the pregnancy. Follow these instructions at home: Activity  Rest as told by your health care provider.  Limit your activity as directed by your health care provider. Lifestyle  Do not use tampons.  Do not douche or have sexual intercourse until your health care provider approves. General instructions  Take over-the-counter and prescription medicines only as told by your health care provider.  Drink enough fluid to keep your urine pale yellow.  Eat foods that are rich in iron, such as spinach, red meat, and legumes.  Keep track of the number of pads you use each day and how soaked they are. Write down this information.  Keep all follow-up visits  as told by your health care provider. This is important.      Contact a health care provider if:  You have a fever.  You are using an increasing number of sanitary pads per hour.  Your bleeding changes from brownish or pink to bright  red.  Your heart rate is faster than usual. Get help right away if you:  Experience severe cramps in your stomach, back, or abdomen.  Pass large clots or tissue. Save any tissue for your health care provider to look at.  Have heavy bleeding that soaks one pad in an hour (or less) for 2 hours.  Faint or become dizzy, weak, or light-headed.  Are urinating less than usual or not at all.  Have shortness of breath or difficulty breathing.  Have sudden chest pain. These symptoms may represent a serious problem that is an emergency. Do not wait to see if the symptoms will go away. Get medical help right away. Call your local emergency services (911 in the [place] Do not drive yourself to the hospital. Summary  Postpartum hemorrhage is losing too much blood after childbirth.  Postpartum hemorrhage usually occurs within the first 24 hours after delivery. Delayed postpartum hemorrhage can happen after you leave the hospital. This is rare.  Follow your health care provider's instructions about activity and diet. Drink plenty of fluid and eat foods that are rich in iron.  Tell your health care provider right away if you have heavy bleeding that soaks one pad in an hour (or less) for 2 hours.  Get help right away if you have severe cramps, shortness of breath, or sudden chest pain. This information is not intended to replace advice given to you by your health care provider. Make sure you discuss any questions you have with your health care provider. Document Revised: 10/04/2018 Document Reviewed: 10/04/2018 Elsevier Patient Education  2021 Elsevier Inc.        Postpartum Care After Vaginal Delivery The following information offers guidance about how to care for yourself from the time you deliver your baby to 6-12 weeks after delivery (postpartum period). If you have problems or questions, contact your health care provider for more specific instructions. Follow these instructions at  home: Vaginal bleeding  It is normal to have vaginal bleeding (lochia) after delivery. Wear a sanitary pad for bleeding and discharge. ? During the first week after delivery, the amount and appearance of lochia is often similar to a menstrual period. ? Over the next few weeks, it will gradually decrease to a dry, yellow-brown discharge. ? For most women, lochia stops completely by 4-6 weeks after delivery, but can vary.  Change your sanitary pads frequently. Watch for any changes in your flow, such as: ? A sudden increase in volume. ? A change in color. ? Large blood clots.  If you pass a blood clot from your vagina, save it and call your health care provider. Do not flush blood clots down the toilet before talking with your health care provider.  Do not use tampons or douches until your health care provider approves.  If you are not breastfeeding, your period should return 6-8 weeks after delivery. If you are feeding your baby breast milk only, your period may not return until you stop breastfeeding. Perineal care  Keep the area between the vagina and the anus (perineum) clean and dry. Use medicated pads and pain-relieving sprays and creams as directed.  If you had a surgical cut in the perineum (episiotomy) or a   tear, check the area for signs of infection until you are healed. Check for: ? More redness, swelling, or pain. ? Fluid or blood coming from the cut or tear. ? Warmth. ? Pus or a bad smell.  You may be given a squirt bottle to use instead of wiping to clean the perineum area after you use the bathroom. Pat the area gently to dry it.  To relieve pain caused by an episiotomy, a tear, or swollen veins in the anus (hemorrhoids), take a warm sitz bath 2-3 times a day. In a sitz bath, the warm water should only come up to your hips and cover your buttocks.   Breast care  In the first few days after delivery, your breasts may feel heavy, full, and uncomfortable (breast  engorgement). Milk may also leak from your breasts. Ask your health care provider about ways to help relieve the discomfort.  If you are breastfeeding: ? Wear a bra that supports your breasts and fits well. Use breast pads to absorb milk that leaks. ? Keep your nipples clean and dry. Apply creams and ointments as told. ? You may have uterine contractions every time you breastfeed for up to several weeks after delivery. This helps your uterus return to its normal size. ? If you have any problems with breastfeeding, notify your health care provider or lactation consultant.  If you are not breastfeeding: ? Avoid touching your breasts. Do not squeeze out (express) milk. Doing this can make your breasts produce more milk. ? Wear a good-fitting bra and use cold packs to help with swelling. Intimacy and sexuality  Ask your health care provider when you can engage in sexual activity. This may depend upon: ? Your risk of infection. ? How fast you are healing. ? Your comfort and desire to engage in sexual activity.  You are able to get pregnant after delivery, even if you have not had your period. Talk with your health care provider about methods of birth control (contraception) or family planning if you desire future pregnancies. Medicines  Take over-the-counter and prescription medicines only as told by your health care provider.  Take an over-the-counter stool softener to help ease bowel movements as told by your health care provider.  If you were prescribed an antibiotic medicine, take it as told by your health care provider. Do not stop taking the antibiotic even if you start to feel better.  Review all previous and current prescriptions to check for possible transfer into breast milk. Activity  Gradually return to your normal activities as told by your health care provider.  Rest as much as possible. Nap while your baby is sleeping. Eating and drinking  Drink enough fluid to keep your  urine pale yellow.  To help prevent or relieve constipation, eat high-fiber foods every day.  Choose healthy eating to support breastfeeding or weight loss goals.  Take your prenatal vitamins until your health care provider tells you to stop.   General tips/recommendations  Do not use any products that contain nicotine or tobacco. These products include cigarettes, chewing tobacco, and vaping devices, such as e-cigarettes. If you need help quitting, ask your health care provider.  Do not drink alcohol, especially if you are breastfeeding.  Do not take medications or drugs that are not prescribed to you, especially if you are breastfeeding.  Visit your health care provider for a postpartum checkup within the first 3-6 weeks after delivery.  Complete a comprehensive postpartum visit no later than 12 weeks after  delivery.  Keep all follow-up visits for you and your baby. Contact a health care provider if:  You feel unusually sad or worried.  Your breasts become red, painful, or hard.  You have a fever or other signs of an infection.  You have bleeding that is soaking through one pad an hour or you have blood clots.  You have a severe headache that doesn't go away or you have vision changes.  You have nausea and vomiting and are unable to eat or drink anything for 24 hours. Get help right away if:  You have chest pain or difficulty breathing.  You have sudden, severe leg pain.  You faint or have a seizure.  You have thoughts about hurting yourself or your baby. If you ever feel like you may hurt yourself or others, or have thoughts about taking your own life, get help right away. Go to your nearest emergency department or:  Call your local emergency services (911 in the [place]  The National Suicide Prevention Lifeline at 818-618-2822. This suicide crisis helpline is open 24 hours a day.  Text the Crisis Text Line at (318) 369-2426 (in the [place] Summary  The period of time  after you deliver your newborn up to 6-12 weeks after delivery is called the postpartum period.  Keep all follow-up visits for you and your baby.  Review all previous and current prescriptions to check for possible transfer into breast milk.  Contact a health care provider if you feel unusually sad or worried during the postpartum period. This information is not intended to replace advice given to you by your health care provider. Make sure you discuss any questions you have with your health care provider. Document Revised: 09/20/2019 Document Reviewed: 09/20/2019 Elsevier Patient Education  2021 [employer].

## 2020-06-03 NOTE — MAU Note (Signed)
Presents with c/o VB that began yesterday.  Reports VB is heavy and passing blood clots, changing sanitary napkin every couple hours.  S/P Cesarean 04/28/2020, bottlefeeding.

## 2022-04-14 ENCOUNTER — Other Ambulatory Visit: Payer: Self-pay

## 2022-04-14 ENCOUNTER — Emergency Department: Payer: Medicaid Other

## 2022-04-14 ENCOUNTER — Emergency Department
Admission: EM | Admit: 2022-04-14 | Discharge: 2022-04-14 | Disposition: A | Discharge status: Home / Self Care | Payer: Medicaid Other | Attending: Emergency Medicine | Admitting: Emergency Medicine

## 2022-04-14 DIAGNOSIS — M25512 Pain in left shoulder: Secondary | ICD-10-CM | POA: Insufficient documentation

## 2022-04-14 DIAGNOSIS — S39012A Strain of muscle, fascia and tendon of lower back, initial encounter: Secondary | ICD-10-CM | POA: Diagnosis not present

## 2022-04-14 DIAGNOSIS — Y92410 Unspecified street and highway as the place of occurrence of the external cause: Secondary | ICD-10-CM | POA: Insufficient documentation

## 2022-04-14 DIAGNOSIS — G8911 Acute pain due to trauma: Secondary | ICD-10-CM

## 2022-04-14 DIAGNOSIS — S3992XA Unspecified injury of lower back, initial encounter: Secondary | ICD-10-CM | POA: Diagnosis present

## 2022-04-14 MED ORDER — HYDROCODONE-ACETAMINOPHEN 5-325 MG PO TABS: 1.0000 | ORAL_TABLET | Freq: Four times a day (QID) | ORAL | 0 refills | Status: AC | PRN

## 2022-04-14 MED ORDER — METHOCARBAMOL 500 MG PO TABS: 500.0000 mg | ORAL_TABLET | Freq: Four times a day (QID) | ORAL | 0 refills | Status: AC

## 2022-04-14 MED ORDER — IBUPROFEN 800 MG PO TABS: 800.0000 mg | ORAL_TABLET | Freq: Three times a day (TID) | ORAL | 0 refills | Status: AC | PRN
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
## 2022-09-26 ENCOUNTER — Emergency Department (HOSPITAL_COMMUNITY)
Admission: EM | Admit: 2022-09-26 | Discharge: 2022-09-26 | Disposition: A | Discharge status: Home / Self Care | Payer: Medicaid Other | Attending: Emergency Medicine | Admitting: Emergency Medicine

## 2022-09-26 ENCOUNTER — Other Ambulatory Visit: Payer: Self-pay

## 2022-09-26 ENCOUNTER — Emergency Department (HOSPITAL_COMMUNITY): Payer: Medicaid Other

## 2022-09-26 ENCOUNTER — Encounter (HOSPITAL_COMMUNITY): Payer: Self-pay | Admitting: *Deleted

## 2022-09-26 DIAGNOSIS — S93402A Sprain of unspecified ligament of left ankle, initial encounter: Secondary | ICD-10-CM | POA: Insufficient documentation

## 2022-09-26 DIAGNOSIS — M25572 Pain in left ankle and joints of left foot: Secondary | ICD-10-CM | POA: Diagnosis present

## 2022-09-26 DIAGNOSIS — W19XXXA Unspecified fall, initial encounter: Secondary | ICD-10-CM

## 2022-09-26 DIAGNOSIS — W1830XA Fall on same level, unspecified, initial encounter: Secondary | ICD-10-CM | POA: Insufficient documentation

## 2022-09-26 MED ORDER — HYDROCODONE-ACETAMINOPHEN 5-325 MG PO TABS
1.0000 | ORAL_TABLET | ORAL | Status: DC | PRN
  Administered 2022-09-26: 1 via ORAL
  Filled 2022-09-26: qty 1

## 2022-09-26 NOTE — Discharge Instructions (Addendum)
It was a pleasure caring for you today.  X-ray was negative for fracture.  Please follow-up with primary care provider.  Seek emergency care if experiencing any new or worsening symptoms.  Alternating between 650 mg Tylenol and 400 mg Advil: The best way to alternate taking Acetaminophen (example Tylenol) and Ibuprofen (example Advil/Motrin) is to take them 3 hours apart. For example, if you take ibuprofen at 6 am you can then take Tylenol at 9 am. You can continue this regimen throughout the day, making sure you do not exceed the recommended maximum dose for each drug.

## 2022-09-26 NOTE — ED Notes (Signed)
C- collar removed per Dr. Karene Fry

## 2022-09-26 NOTE — ED Provider Notes (Signed)
[place] EMERGENCY DEPARTMENT AT [facility] Provider Note   CSN: [id number] Arrival date & time: 09/26/22  1232     History  Chief Complaint  Patient presents with   [name] Rosales is a 32 y.o. female who presents to ED concerned with left ankle pain after a fall. Patient stepped off of the side of her bathtub, stepped on a toy truck, and fell to the ground. Patient denies head trauma, LOC, seizures. Patient only complaining of left ankle pain.  Denies fever, chest pain, dyspnea, cough, nausea, vomiting.    Fall       Home Medications Prior to Admission medications   Medication Sig Start Date End Date Taking? Authorizing Provider  ibuprofen (ADVIL) 800 MG tablet Take 1 tablet (800 mg total) by mouth every 8 (eight) hours as needed. 04/14/22   Triplett, Rulon Eisenmenger B, FNP  methocarbamol (ROBAXIN) 500 MG tablet Take 1 tablet (500 mg total) by mouth 4 (four) times daily. 04/14/22   Chinita Pester, FNP      Allergies    Codeine and Other    Review of Systems   Review of Systems  Musculoskeletal:        Ankle pain    Physical Exam Updated Vital Signs BP 113/69   Pulse 72   Temp 97.6 F (36.4 C)   Resp 20   Ht 5\' 4"  (1.626 m)   Wt 131.5 kg   SpO2 100%   BMI 49.78 kg/m  Physical Exam Vitals and nursing note reviewed.  Constitutional:      General: She is not in acute distress.    Appearance: She is not ill-appearing or toxic-appearing.  HENT:     Head: Normocephalic and atraumatic.  Eyes:     General: No scleral icterus.       Right eye: No discharge.        Left eye: No discharge.     Conjunctiva/sclera: Conjunctivae normal.  Cardiovascular:     Rate and Rhythm: Normal rate.  Pulmonary:     Effort: Pulmonary effort is normal.  Abdominal:     General: Abdomen is flat.  Musculoskeletal:     Comments: Left ankle: moderate swelling. No erythema or increased warmth. +2 pedal pulses. Sensation to light touch intact. Toe ROM intact. Ankle  ROM restricted d/t pain. Area is non-tense  Skin:    General: Skin is warm and dry.  Neurological:     General: No focal deficit present.     Mental Status: She is alert and oriented to person, place, and time. Mental status is at baseline.  Psychiatric:        Mood and Affect: Mood normal.        Behavior: Behavior normal.     ED Results / Procedures / Treatments   Labs (all labs ordered are listed, but only abnormal results are displayed) Labs Reviewed - No data to display  EKG None  Radiology DG Ankle Complete Left  Result Date: 09/26/2022 CLINICAL DATA:  Left ankle pain post fall. EXAM: LEFT ANKLE COMPLETE - 3+ VIEW COMPARISON:  None Available. FINDINGS: There is no evidence of fracture, dislocation, or joint effusion. There is no evidence of arthropathy or other focal bone abnormality. Moderate swelling about the lateral malleolus. IMPRESSION: 1. No acute fracture or dislocation identified about the left ankle. 2. Moderate swelling about the lateral malleolus. Electronically Signed   By: Ted Mcalpine M.D.   On: 09/26/2022 13:22  Procedures Procedures    Medications Ordered in ED Medications  HYDROcodone-acetaminophen (NORCO/VICODIN) 5-325 MG per tablet 1 tablet (1 tablet Oral Given 09/26/22 1431)    ED Course/ Medical Decision Making/ A&P                                 Medical Decision Making Amount and/or Complexity of Data Reviewed Radiology: ordered.   This patient presents to the ED for concern of ankle pain, this involves an extensive number of treatment options, and is a complaint that carries with it a high risk of complications and morbidity.  The differential diagnosis includes hemarthrosis, gout, septic joint, fracture, tendonitis, compartment syndrome   Co morbidities that complicate the patient evaluation  none    Imaging Studies ordered:  I ordered imaging studies including -Xray: To assess for process contributing to patient's  symptoms I independently visualized and interpreted imaging Shared findings with patient I agree with the radiologist interpretation   Problem List / ED Course / Critical interventions / Medication management  Patient presents to ED concerned for left ankle pain after a fall.  Patient denies head trauma, loss of consciousness, seizures, nausea, vomiting.  Physical exam reassuring with +2 pedal pulses and sensation to light touch intact of the left foot.  Left ankle is moderately swollen and active range of motion is restricted due to pain.  X-ray without concern for fractures or dislocation.  Educated patient on the need to follow-up with primary care provider for repeat imaging if her symptoms are not appropriately resolving.  Provided patient with educational handouts for ankle rehabilitation.  Patient verbalized understanding of plan. Patient afebrile with stable vitals.  Provided return precautions.  Discharged in good condition. I have reviewed the patients home medicines and have made adjustments as needed   Ddx these are considered less likely due to history of present illness and physical exam -gout: no warmth or erythema -septic joint: afebrile; no warmth or erythema; no skin changes -fracture: xray without concern  -compartment syndrome: area not tense; neurovascularly intact   Social Determinants of Health:  none          Final Clinical Impression(s) / ED Diagnoses Final diagnoses:  Sprain of left ankle, unspecified ligament, initial encounter  Fall, initial encounter    Rx / DC Orders ED Discharge Orders     None         Dorthy Cooler, [place] 09/26/22 1455    Terald Sleeper, MD 09/26/22 1537

## 2022-09-26 NOTE — ED Provider Triage Note (Signed)
Emergency Medicine Provider Triage Evaluation Note  Lislie Gohlke Franson , a 32 y.o. female  was evaluated in triage.  Pt complains of left ankle pain after a fall.  The patient states that she was stepping out of the shower and accidentally stepped onto a toy, rolling her left ankle.  She notices pain along the lateral aspect of her ankle with associated swelling.  She denies any head trauma or loss of consciousness.  She denies any other injuries or complaints.  She is not on anticoagulation.  Review of Systems  Positive: Ankle pain Negative: Chest pain, headache, neck pain  Physical Exam  BP (!) 130/92 (BP Location: Left Arm)   Pulse 71   Temp 97.6 F (36.4 C)   Resp 16   Ht 5\' 4"  (1.626 m)   Wt 131.5 kg   SpO2 98%   BMI 49.78 kg/m  Gen:   Awake, no distress , c-collar in place HEENT:  No midline TTP of the cervical spine Resp:  Normal effort  MSK:   Moves extremities without difficulty, swelling and TTP of the lateral aspect of the left ankle, 2+ DP pulses   Medical Decision Making  Medically screening exam initiated at 12:44 PM.  Appropriate orders placed.  Taiyari Cloran Hellard was informed that the remainder of the evaluation will be completed by another provider, this initial triage assessment does not replace that evaluation, and the importance of remaining in the ED until their evaluation is complete.  [place] ankle sprain versus fracture, x-ray imaging of the left ankle ordered, pain medicine ordered.  Patient c-collar placed by EMS cleared.    Ernie Avena, MD 09/26/22 1249

## 2022-09-26 NOTE — ED Notes (Signed)
Attempted to call ortho tech, no answer.

## 2022-09-26 NOTE — ED Triage Notes (Signed)
Patient states she was standing on the side of the tub stepped down and stepped on a toy truck  falling to the ground did not hit her head c/o pain in her left ankle patient was able to get herself up however very painful to walk. Able to move her toes positive left pedal pulse.
